# Patient Record
Sex: Female | Born: 1945 | Race: White | Hispanic: No | Marital: Married | Smoking: Never smoker
Health system: Southern US, Community
[De-identification: ages and names within clinical notes are randomized; demographics above are authoritative.]

## PROBLEM LIST (undated history)

## (undated) DIAGNOSIS — M1990 Unspecified osteoarthritis, unspecified site: Secondary | ICD-10-CM

## (undated) HISTORY — PX: NO PAST SURGERIES: SHX2092

## (undated) HISTORY — DX: Unspecified osteoarthritis, unspecified site: M19.90

---

## 2006-09-13 ENCOUNTER — Ambulatory Visit: Payer: Self-pay

## 2007-11-28 IMAGING — CR RIGHT INDEX FINGER 2+V
1 series · 3 of 3 positions shown · non-contrast
Comparison: none

REASON FOR EXAM: xray rt index finger dr Sayyad [phone] pain cut finger
COMMENTS:

[Series 1: view not recorded · 0.17mm/px · 3 of 3 slices shown]
[im 1/3]
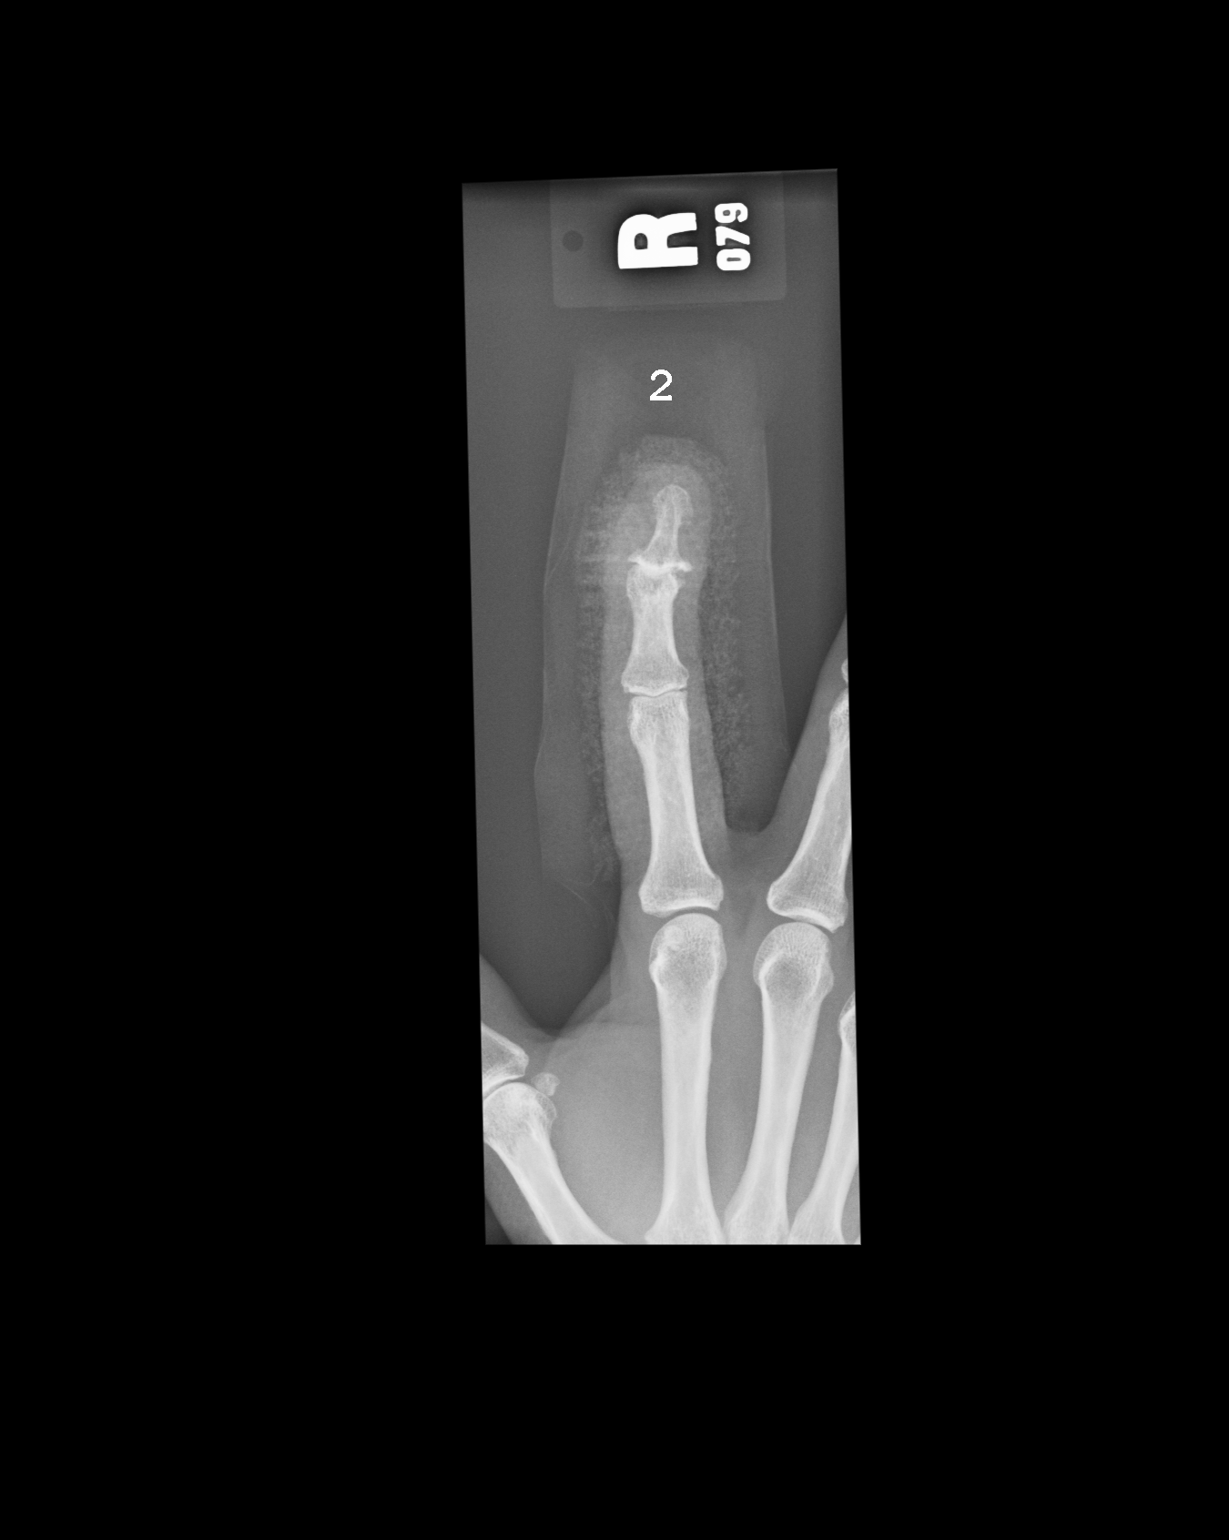
[im 2/3]
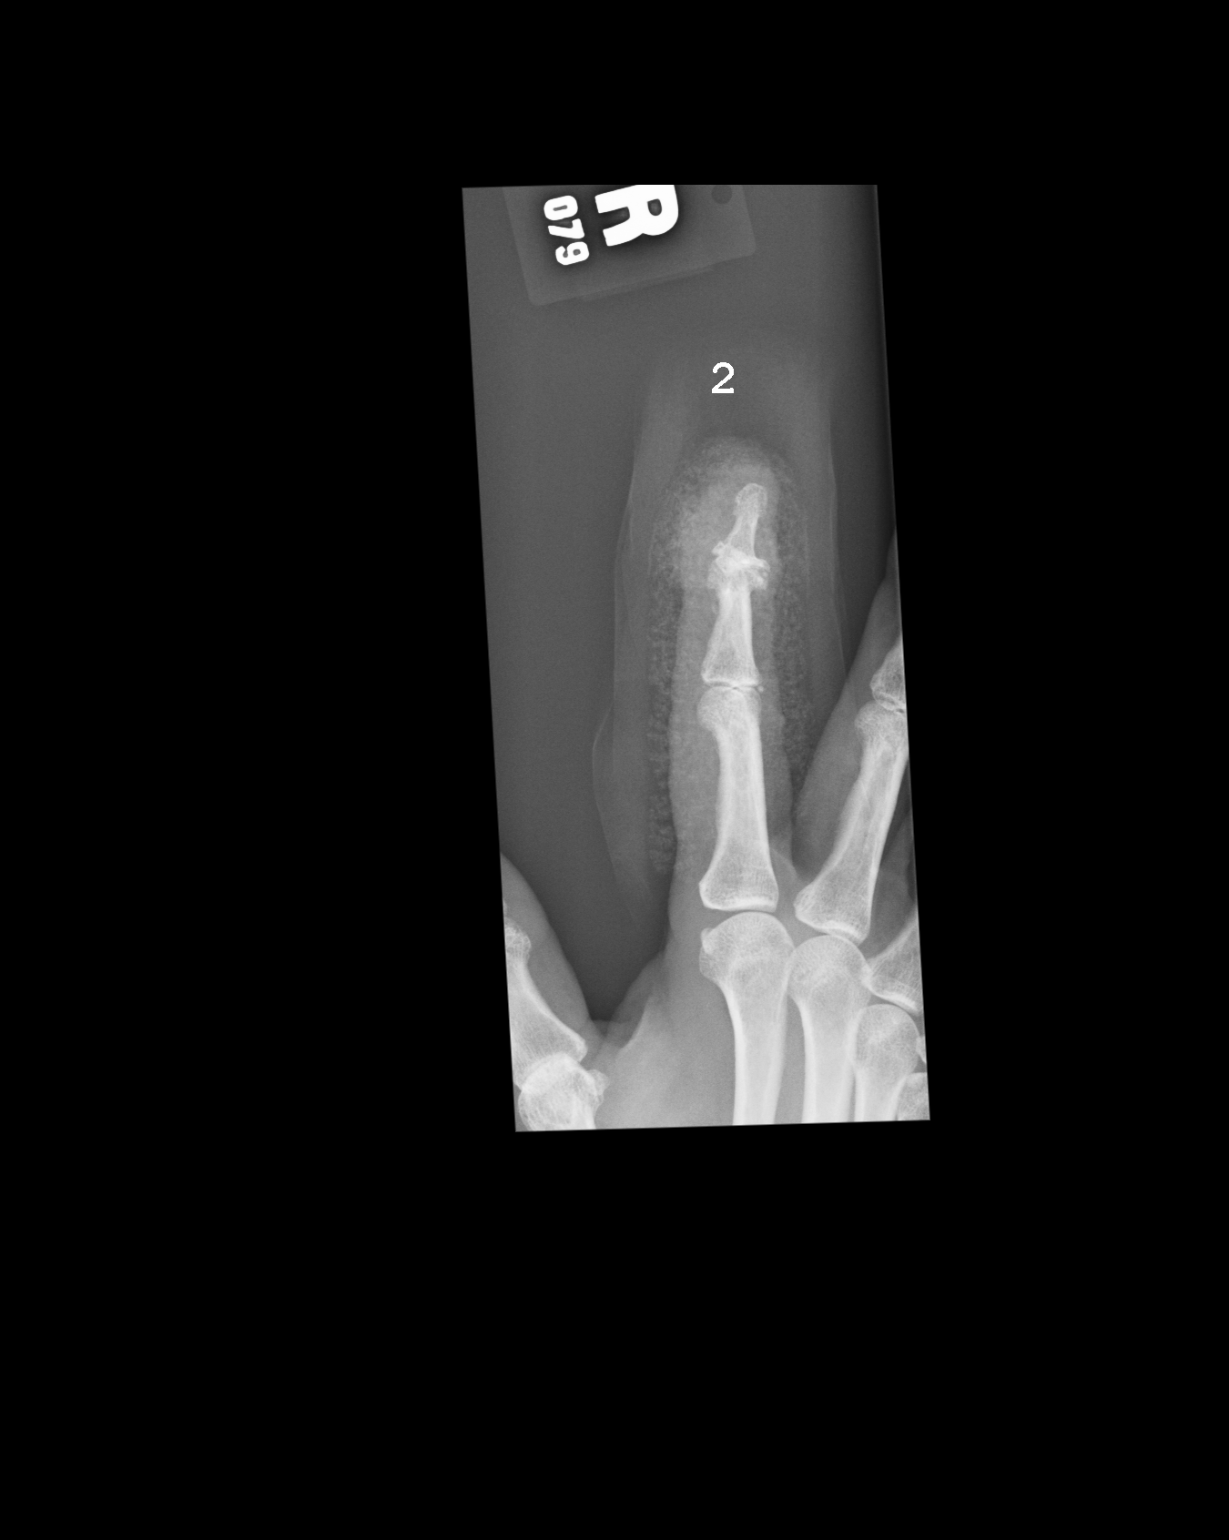
[im 3/3]
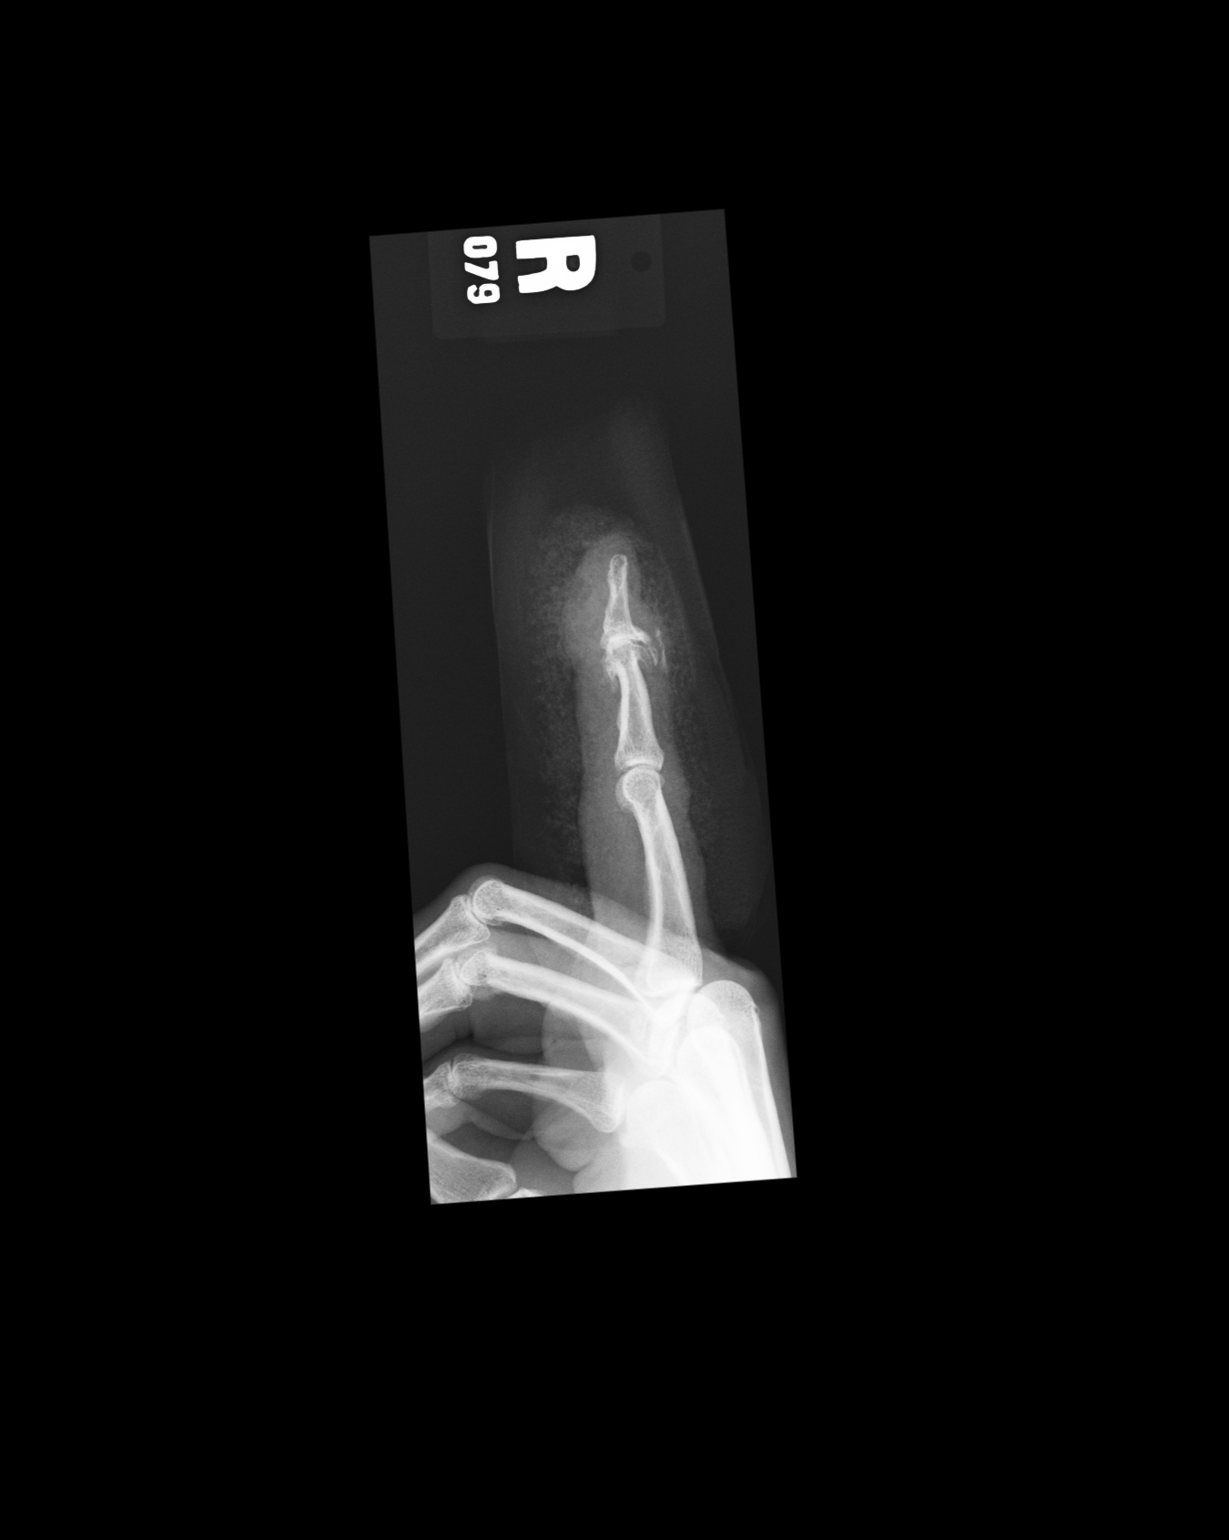

[3 of 3 positions shown; findings below may reference images not displayed]

PROCEDURE:     DXR - DXR FINGER INDEX 2ND DIGIT RT HA  - September 13, 2006  [DATE]

RESULT:     Three views were obtained. There is a bandage present about the
index finger. There is marked deformity of the DIP joint compatible with
arthritic spurring. No acute fracture is seen. No soft tissue foreign body
is identified.
IMPRESSION: 1. No acute fracture is identified.
2. Arthritic changes are noted at the DIP joint.

## 2018-01-02 ENCOUNTER — Telehealth: Payer: Self-pay | Admitting: Internal Medicine

## 2018-01-02 NOTE — Telephone Encounter (Signed)
Copied from CRM [id number]. Topic: Quick Communication - See Telephone Encounter >> Jan 02, 2018  4:30 PM Jens Som A wrote: CRM for notification. See Telephone encounter for: 01/02/18. Patient husband called to get Debra Barr in as a new patient of Dr. Lorin Picket.  Dr. Lorin Picket gave verbal ok to her patients Debra Barr (Son) & Debra Barr (Daugther in [place]) .  [facility] wanted to write documentation that it was [facility] to schedule as new patient. Please advise Patients call back number [phone]

## 2018-01-02 NOTE — Telephone Encounter (Signed)
FYI

## 2018-01-03 NOTE — Telephone Encounter (Signed)
msg sent to provider 

## 2018-01-04 NOTE — Telephone Encounter (Signed)
ok'd per Dr Lorin Picket lm to schedule new pt appt

## 2018-01-05 NOTE — Telephone Encounter (Signed)
° °  Husband call to make pt appt .there is no availability will not allow [facility] to schedule please call   [phone]

## 2018-05-30 ENCOUNTER — Telehealth: Payer: Self-pay | Admitting: Internal Medicine

## 2018-05-30 NOTE — Telephone Encounter (Signed)
Copied from CRM [id number]. Topic: Appointment Scheduling - Scheduling Inquiry for Clinic >> May 30, 2018  9:41 AM Gean Birchwood R wrote: Patients husband called in and stated she canceled her new patient appt that was scheduled for 06/01/2018 at 2pm with Dr Lorin Picket back in September. And she really needs this appt. Patient has dementia and didn't know what she was doing when she canceled. Husband is requesting a call back to see if patient can be worked back in.  CB# [phone]

## 2018-05-30 NOTE — Telephone Encounter (Signed)
Copied from CRM [id number]. Topic: Appointment Scheduling - Scheduling Inquiry for Clinic >> May 30, 2018  9:41 AM Gean Birchwood R wrote: Patients husband called in and stated she canceled her new patient appt that was scheduled for 06/01/2018 at 2pm with Dr Lorin Picket back in September. And she really needs this appt. Patient has dementia and didn't know what she was doing when she canceled. Husband is requesting a call back to see if patient can be worked back in.  CB# [phone] >> May 30, 2018  9:46 AM Gean Birchwood R wrote: Appointment Scheduled.

## 2018-06-01 ENCOUNTER — Encounter: Payer: Self-pay | Admitting: Internal Medicine

## 2018-06-01 ENCOUNTER — Ambulatory Visit: Payer: Self-pay | Admitting: Internal Medicine

## 2018-06-01 ENCOUNTER — Ambulatory Visit: Payer: Medicare Other | Admitting: Internal Medicine

## 2018-06-01 VITALS — BP 142/80 | HR 83 | Temp 97.9°F | Resp 16 | Ht 63.5 in | Wt 178.4 lb

## 2018-06-01 DIAGNOSIS — M7660 Achilles tendinitis, unspecified leg: Secondary | ICD-10-CM | POA: Diagnosis not present

## 2018-06-01 DIAGNOSIS — Z1211 Encounter for screening for malignant neoplasm of colon: Secondary | ICD-10-CM

## 2018-06-01 DIAGNOSIS — R030 Elevated blood-pressure reading, without diagnosis of hypertension: Secondary | ICD-10-CM | POA: Diagnosis not present

## 2018-06-01 DIAGNOSIS — Z1239 Encounter for other screening for malignant neoplasm of breast: Secondary | ICD-10-CM

## 2018-06-01 DIAGNOSIS — R413 Other amnesia: Secondary | ICD-10-CM | POA: Diagnosis not present

## 2018-06-01 LAB — TSH: TSH: 1.89 u[IU]/mL (ref 0.35–4.50)

## 2018-06-01 LAB — CBC WITH DIFFERENTIAL/PLATELET
BASOS ABS: 0 10*3/uL (ref 0.0–0.1)
Basophils Relative: 0.3 % (ref 0.0–3.0)
Eosinophils Absolute: 0 10*3/uL (ref 0.0–0.7)
Eosinophils Relative: 0.8 % (ref 0.0–5.0)
HCT: 43.5 % (ref 36.0–46.0)
HEMOGLOBIN: 14.5 g/dL (ref 12.0–15.0)
LYMPHS ABS: 1.7 10*3/uL (ref 0.7–4.0)
Lymphocytes Relative: 28.9 % (ref 12.0–46.0)
MCHC: 33.2 g/dL (ref 30.0–36.0)
MCV: 88.7 fl (ref 78.0–100.0)
MONOS PCT: 5.2 % (ref 3.0–12.0)
Monocytes Absolute: 0.3 10*3/uL (ref 0.1–1.0)
NEUTROS PCT: 64.8 % (ref 43.0–77.0)
Neutro Abs: 3.7 10*3/uL (ref 1.4–7.7)
Platelets: 332 10*3/uL (ref 150.0–400.0)
RBC: 4.91 Mil/uL (ref 3.87–5.11)
RDW: 14.8 % (ref 11.5–15.5)
WBC: 5.8 10*3/uL (ref 4.0–10.5)

## 2018-06-01 LAB — VITAMIN B12: VITAMIN B 12: 171 pg/mL — AB (ref 211–911)

## 2018-06-01 LAB — COMPREHENSIVE METABOLIC PANEL
ALBUMIN: 4.3 g/dL (ref 3.5–5.2)
ALK PHOS: 155 U/L — AB (ref 39–117)
ALT: 13 U/L (ref 0–35)
AST: 16 U/L (ref 0–37)
BILIRUBIN TOTAL: 0.6 mg/dL (ref 0.2–1.2)
BUN: 12 mg/dL (ref 6–23)
CO2: 25 mEq/L (ref 19–32)
CREATININE: 0.65 mg/dL (ref 0.40–1.20)
Calcium: 9.4 mg/dL (ref 8.4–10.5)
Chloride: 105 mEq/L (ref 96–112)
GFR: 89.14 mL/min (ref >60.00)
GLUCOSE: 83 mg/dL (ref 70–99)
Potassium: 3.7 mEq/L (ref 3.5–5.1)
SODIUM: 142 meq/L (ref 135–145)
TOTAL PROTEIN: 7.7 g/dL (ref 6.0–8.3)

## 2018-06-01 LAB — LIPID PANEL
CHOL/HDL RATIO: 4
Cholesterol: 216 mg/dL — ABNORMAL HIGH (ref 0–200)
HDL: 55 mg/dL (ref >39.00)
LDL Cholesterol: 145 mg/dL — ABNORMAL HIGH (ref 0–99)
NONHDL: 160.75
Triglycerides: 77 mg/dL (ref 0.0–149.0)
VLDL: 15.4 mg/dL (ref 0.0–40.0)

## 2018-06-01 NOTE — Progress Notes (Addendum)
Patient ID: Wiliam KeSandra M Sciortino, female   DOB: 06/04/1945, 73 y.o.   MRN: [id number]   Subjective:    Patient ID: Wiliam KeSandra M Tillery, female    DOB: 10/05/1945, 73 y.o.   MRN: [id number]  HPI  Patient here to establish care.  She was previously followed at [facility].  Reports she has never been in the hospital for anything other than child birth.  Has four children - 3 sons and 1 daughter.  Tries to stay active.  No chest pain.  No sob. No acid reflux.  No abdominal pain.  Bowels moving.  No urine change.  States has never had to be on cholesterol medication or blood pressure medication.  Works in her garden.  Does report some pain in her right achilles.  Persistent.  Worse at times.  Also reports that her husband has been concerned about some memory change.  Declines mammogram and colonoscopy.     Past Medical History:  Diagnosis Date  . Arthritis    Past Surgical History:  Procedure Laterality Date  . NO PAST SURGERIES     Family History  Problem Relation Age of Onset  . Arthritis Mother   . Early death Father   . Hyperlipidemia Father   . Hypertension Brother   . Kidney disease Brother    Social History   Socioeconomic History  . Marital status: Married    Spouse name: Not on file  . Number of children: Not on file  . Years of education: Not on file  . Highest education level: Not on file  Occupational History  . Not on file  Social Needs  . Financial resource strain: Not on file  . Food insecurity:    Worry: Not on file    Inability: Not on file  . Transportation needs:    Medical: Not on file    Non-medical: Not on file  Tobacco Use  . Smoking status: Never Smoker  . Smokeless tobacco: Never Used  Substance and Sexual Activity  . Alcohol use: Yes    Frequency: Never  . Drug use: Never  . Sexual activity: Not Currently  Lifestyle  . Physical activity:    Days per week: Not on file    Minutes per session: Not on file  . Stress: Not on file  Relationships  .  Social connections:    Talks on phone: Not on file    Gets together: Not on file    Attends religious service: Not on file    Active member of club or organization: Not on file    Attends meetings of clubs or organizations: Not on file    Relationship status: Not on file  Other Topics Concern  . Not on file  Social History Narrative  . Not on file    Outpatient Encounter Medications as of 06/01/2018  Medication Sig  . Ascorbic Acid (VITAMIN C PO) Take by mouth.  . Coenzyme Q10 (COQ10 PO) Take by mouth.  . Omega-3 Fatty Acids (FISH OIL PO) Take by mouth.   No facility-administered encounter medications on file as of 06/01/2018.     Review of Systems  Constitutional: Negative for appetite change and unexpected weight change.  HENT: Negative for congestion and sinus pressure.   Respiratory: Negative for cough, chest tightness and shortness of breath.   Cardiovascular: Negative for chest pain, palpitations and leg swelling.  Gastrointestinal: Negative for abdominal pain, diarrhea, nausea and vomiting.  Genitourinary: Negative for difficulty urinating and dysuria.  Musculoskeletal:  Negative for joint swelling and myalgias.       Pain - right achilles.    Skin: Negative for color change and rash.  Neurological: Negative for dizziness, light-headedness and headaches.  Psychiatric/Behavioral: Negative for agitation and dysphoric mood.       Objective:    Physical Exam Constitutional:      General: She is not in acute distress.    Appearance: Normal appearance.  HENT:     Nose: Nose normal. No congestion.     Mouth/Throat:     Pharynx: No oropharyngeal exudate or posterior oropharyngeal erythema.  Neck:     Musculoskeletal: Neck supple. No muscular tenderness.     Thyroid: No thyromegaly.  Cardiovascular:     Rate and Rhythm: Normal rate and regular rhythm.  Pulmonary:     Effort: No respiratory distress.     Breath sounds: Normal breath sounds. No wheezing.  Abdominal:      General: Bowel sounds are normal.     Palpations: Abdomen is soft.     Tenderness: There is no abdominal tenderness.  Musculoskeletal:        General: No swelling or tenderness.     Comments: Increased pain with palpation of the right achilles.    Lymphadenopathy:     Cervical: No cervical adenopathy.  Skin:    Findings: No erythema or rash.  Neurological:     Mental Status: She is alert.  Psychiatric:        Mood and Affect: Mood normal.        Behavior: Behavior normal.     BP (!) 142/80 (BP Location: Left Arm, Patient Position: Sitting, Cuff Size: Normal)   Pulse 83   Temp 97.9 F (36.6 C) (Oral)   Resp 16   Ht 5' 3.5" (1.613 m)   Wt 178 lb 6.4 oz (80.9 kg)   SpO2 97%   BMI 31.11 kg/m  Wt Readings from Last 3 Encounters:  06/01/18 178 lb 6.4 oz (80.9 kg)       Assessment & Plan:   Problem List Items Addressed This Visit    Elevated blood pressure reading    Blood pressure elevated here in the office.  Has never been on medication.  Will have her spot check her pressure.  Get her back in soon to reassess.  If persistent elevation, will start blood pressure medication.        Relevant Orders   CBC with Differential/Platelet (Completed)   Comprehensive metabolic panel (Completed)   Lipid panel (Completed)   Memory change - Primary    Discussed with her today.  She has not noticed a significant change.  States her husband has discussed this with her.  Will check routine labs including tsh and B12.  Obtain outside records.  On no prescription medication.  Follow.        Relevant Orders   TSH (Completed)   Vitamin B12 (Completed)   Pain in Achilles tendon    Persistent pain and tenderness.  Exam as outlined.  Refer to podiatry.        Relevant Orders   Ambulatory referral to Podiatry    Other Visit Diagnoses    Breast cancer screening       Declines mammogram.    Colon cancer screening       Declines.         Dale Durhamharlene Kellina Dreese, MD

## 2018-06-04 ENCOUNTER — Encounter: Payer: Self-pay | Admitting: Internal Medicine

## 2018-06-04 DIAGNOSIS — M7660 Achilles tendinitis, unspecified leg: Secondary | ICD-10-CM | POA: Insufficient documentation

## 2018-06-04 DIAGNOSIS — R413 Other amnesia: Secondary | ICD-10-CM | POA: Insufficient documentation

## 2018-06-04 DIAGNOSIS — I10 Essential (primary) hypertension: Secondary | ICD-10-CM | POA: Insufficient documentation

## 2018-06-04 NOTE — Assessment & Plan Note (Signed)
Blood pressure elevated here in the office.  Has never been on medication.  Will have her spot check her pressure.  Get her back in soon to reassess.  If persistent elevation, will start blood pressure medication.

## 2018-06-04 NOTE — Assessment & Plan Note (Signed)
Persistent pain and tenderness.  Exam as outlined.  Refer to podiatry.

## 2018-06-04 NOTE — Assessment & Plan Note (Signed)
Discussed with her today.  She has not noticed a significant change.  States her husband has discussed this with her.  Will check routine labs including tsh and B12.  Obtain outside records.  On no prescription medication.  Follow.

## 2018-06-04 NOTE — Addendum Note (Signed)
Addended by: Charm Barges on: 06/04/2018 09:01 AM   Modules accepted: Orders

## 2018-06-14 ENCOUNTER — Ambulatory Visit (INDEPENDENT_AMBULATORY_CARE_PROVIDER_SITE_OTHER): Payer: Medicare Other | Admitting: Lab

## 2018-06-14 DIAGNOSIS — E538 Deficiency of other specified B group vitamins: Secondary | ICD-10-CM

## 2018-06-14 MED ORDER — CYANOCOBALAMIN 1000 MCG/ML IJ SOLN
1000.0000 ug | Freq: Once | INTRAMUSCULAR | Status: AC
  Administered 2018-06-14: 1000 ug via INTRAMUSCULAR

## 2018-06-14 NOTE — Progress Notes (Addendum)
Pt here today for first  Vit-B12 injection in Right Deltoid. Pt tolerated well.  Reviewed.  Dr Lorin Picket

## 2018-06-29 ENCOUNTER — Ambulatory Visit (INDEPENDENT_AMBULATORY_CARE_PROVIDER_SITE_OTHER): Payer: Medicare Other | Admitting: Internal Medicine

## 2018-06-29 ENCOUNTER — Encounter: Payer: Self-pay | Admitting: Internal Medicine

## 2018-06-29 VITALS — BP 160/100 | HR 77 | Temp 97.6°F | Resp 16 | Wt 179.2 lb

## 2018-06-29 DIAGNOSIS — I10 Essential (primary) hypertension: Secondary | ICD-10-CM

## 2018-06-29 DIAGNOSIS — E538 Deficiency of other specified B group vitamins: Secondary | ICD-10-CM

## 2018-06-29 DIAGNOSIS — R413 Other amnesia: Secondary | ICD-10-CM

## 2018-06-29 MED ORDER — AMLODIPINE BESYLATE 5 MG PO TABS: 5.0000 mg | ORAL_TABLET | Freq: Every day | ORAL | 2 refills | Status: DC

## 2018-06-29 MED ORDER — CYANOCOBALAMIN 1000 MCG/ML IJ SOLN
1000.0000 ug | Freq: Once | INTRAMUSCULAR | Status: AC
  Administered 2018-06-29: 1000 ug via INTRAMUSCULAR

## 2018-06-29 NOTE — Progress Notes (Signed)
Patient ID: Debra Barr, female   DOB: 11/12/1945, 73 y.o.   MRN: [id number]   Subjective:    Patient ID: Debra Barr, female    DOB: 06-26-1945, 73 y.o.   MRN: [id number]  HPI  Patient here for a scheduled follow up.  She reports she is doing relatively well.  Saw her as a new pt last visit.  Started b12 injections.  Stays active.  No chest pain.  No sob.  No acid reflux.  No abdominal pain.  Bowels moving.  Discussed any concerns she may have regarding her memory.  Performed mini mental status.  She knew day, date and year.  Knew the president.  Knew how to spell world backwards and subtract by serial 7s.  Also could recall 2/3 objects.  States her main concerns is remembering names.  Discussed blood pressure elevation.     Past Medical History:  Diagnosis Date  . Arthritis    Past Surgical History:  Procedure Laterality Date  . NO PAST SURGERIES     Family History  Problem Relation Age of Onset  . Arthritis Mother   . Early death Father   . Hyperlipidemia Father   . Hypertension Brother   . Kidney disease Brother    Social History   Socioeconomic History  . Marital status: Married    Spouse name: Not on file  . Number of children: Not on file  . Years of education: Not on file  . Highest education level: Not on file  Occupational History  . Not on file  Social Needs  . Financial resource strain: Not on file  . Food insecurity:    Worry: Not on file    Inability: Not on file  . Transportation needs:    Medical: Not on file    Non-medical: Not on file  Tobacco Use  . Smoking status: Never Smoker  . Smokeless tobacco: Never Used  Substance and Sexual Activity  . Alcohol use: Yes    Frequency: Never  . Drug use: Never  . Sexual activity: Not Currently  Lifestyle  . Physical activity:    Days per week: Not on file    Minutes per session: Not on file  . Stress: Not on file  Relationships  . Social connections:    Talks on phone: Not on file    Gets  together: Not on file    Attends religious service: Not on file    Active member of club or organization: Not on file    Attends meetings of clubs or organizations: Not on file    Relationship status: Not on file  Other Topics Concern  . Not on file  Social History Narrative  . Not on file    Outpatient Encounter Medications as of 06/29/2018  Medication Sig  . Ascorbic Acid (VITAMIN C PO) Take by mouth.  . Coenzyme Q10 (COQ10 PO) Take by mouth.  . Omega-3 Fatty Acids (FISH OIL PO) Take by mouth.  Marland Kitchen amLODipine (NORVASC) 5 MG tablet Take 1 tablet (5 mg total) by mouth daily.  . [EXPIRED] cyanocobalamin ((VITAMIN B-12)) injection 1,000 mcg    No facility-administered encounter medications on file as of 06/29/2018.     Review of Systems  Constitutional: Negative for appetite change and unexpected weight change.  HENT: Negative for congestion and sinus pressure.   Respiratory: Negative for cough, chest tightness and shortness of breath.   Cardiovascular: Negative for chest pain, palpitations and leg swelling.  Gastrointestinal: Negative for  abdominal pain, diarrhea, nausea and vomiting.  Genitourinary: Negative for difficulty urinating and dysuria.  Musculoskeletal: Negative for joint swelling and myalgias.  Skin: Negative for color change and rash.  Neurological: Negative for dizziness, light-headedness and headaches.  Psychiatric/Behavioral: Negative for agitation and dysphoric mood.       Objective:    Physical Exam Constitutional:      General: She is not in acute distress.    Appearance: Normal appearance.  HENT:     Nose: Nose normal. No congestion.     Mouth/Throat:     Pharynx: No oropharyngeal exudate or posterior oropharyngeal erythema.  Neck:     Musculoskeletal: Neck supple. No muscular tenderness.     Thyroid: No thyromegaly.  Cardiovascular:     Rate and Rhythm: Normal rate and regular rhythm.  Pulmonary:     Effort: No respiratory distress.     Breath  sounds: Normal breath sounds. No wheezing.  Abdominal:     General: Bowel sounds are normal.     Palpations: Abdomen is soft.     Tenderness: There is no abdominal tenderness.  Musculoskeletal:        General: No swelling or tenderness.  Lymphadenopathy:     Cervical: No cervical adenopathy.  Skin:    Findings: No erythema or rash.  Neurological:     Mental Status: She is alert.  Psychiatric:        Mood and Affect: Mood normal.        Behavior: Behavior normal.     BP (!) 160/100   Pulse 77   Temp 97.6 F (36.4 C) (Oral)   Resp 16   Wt 179 lb 3.2 oz (81.3 kg)   SpO2 99%   BMI 31.25 kg/m  Wt Readings from Last 3 Encounters:  06/29/18 179 lb 3.2 oz (81.3 kg)  06/01/18 178 lb 6.4 oz (80.9 kg)     Lab Results  Component Value Date   WBC 5.8 06/01/2018   HGB 14.5 06/01/2018   HCT 43.5 06/01/2018   PLT 332.0 06/01/2018   GLUCOSE 83 06/01/2018   CHOL 216 (H) 06/01/2018   TRIG 77.0 06/01/2018   HDL 55.00 06/01/2018   LDLCALC 145 (H) 06/01/2018   ALT 13 06/01/2018   AST 16 06/01/2018   NA 142 06/01/2018   K 3.7 06/01/2018   CL 105 06/01/2018   CREATININE 0.65 06/01/2018   BUN 12 06/01/2018   CO2 25 06/01/2018   TSH 1.89 06/01/2018       Assessment & Plan:   Problem List Items Addressed This Visit    Hypertension, essential    Blood pressure remains elevated.  Discussed with her today.  Discussed starting a blood pressure medication.  She had some hesitations.  Agreed to start amlodipine 5mg  q day.  Follow pressures.  Follow metabolic panel.       Relevant Medications   amLODipine (NORVASC) 5 MG tablet   Memory change    Did well on her mini mental status exam.  Continue B12 injections.  Hold on further evaluation at this time.  Follow.         Other Visit Diagnoses    B12 deficiency    -  Primary   Relevant Medications   cyanocobalamin ((VITAMIN B-12)) injection 1,000 mcg (Completed)       Dale Baytown, MD

## 2018-07-01 ENCOUNTER — Encounter: Payer: Self-pay | Admitting: Internal Medicine

## 2018-07-01 NOTE — Assessment & Plan Note (Signed)
Did well on her mini mental status exam.  Continue B12 injections.  Hold on further evaluation at this time.  Follow.

## 2018-07-01 NOTE — Assessment & Plan Note (Signed)
Blood pressure remains elevated.  Discussed with her today.  Discussed starting a blood pressure medication.  She had some hesitations.  Agreed to start amlodipine 5mg  q day.  Follow pressures.  Follow metabolic panel.

## 2018-08-15 ENCOUNTER — Other Ambulatory Visit: Payer: Self-pay

## 2018-08-15 ENCOUNTER — Ambulatory Visit (INDEPENDENT_AMBULATORY_CARE_PROVIDER_SITE_OTHER): Payer: Medicare Other

## 2018-08-15 DIAGNOSIS — E538 Deficiency of other specified B group vitamins: Secondary | ICD-10-CM

## 2018-08-15 MED ORDER — CYANOCOBALAMIN 1000 MCG/ML IJ SOLN
1000.0000 ug | Freq: Once | INTRAMUSCULAR | Status: AC
  Administered 2018-08-15: 1000 ug via INTRAMUSCULAR

## 2018-08-15 NOTE — Progress Notes (Addendum)
Patient presented today for B12 injection.  Administered IM in left deltoid.  Patient tolerated well.  Jelani Trueba, CMA  Reviewed.  Dr Scott   

## 2018-09-19 ENCOUNTER — Ambulatory Visit (INDEPENDENT_AMBULATORY_CARE_PROVIDER_SITE_OTHER): Payer: Medicare Other | Admitting: *Deleted

## 2018-09-19 ENCOUNTER — Other Ambulatory Visit: Payer: Self-pay

## 2018-09-19 DIAGNOSIS — E538 Deficiency of other specified B group vitamins: Secondary | ICD-10-CM

## 2018-09-20 MED ORDER — CYANOCOBALAMIN 1000 MCG/ML IJ SOLN
1000.0000 ug | Freq: Once | INTRAMUSCULAR | Status: AC
  Administered 2018-09-19: 11:00:00 1000 ug via INTRAMUSCULAR

## 2018-09-20 NOTE — Progress Notes (Addendum)
Patient presented for B 12 injection to right deltoid, patient voiced no concerns nor showed any signs of distress during injection.  Reviewed.  Dr Scott 

## 2018-10-03 ENCOUNTER — Other Ambulatory Visit: Payer: Self-pay | Admitting: Internal Medicine

## 2018-10-24 ENCOUNTER — Ambulatory Visit: Payer: Medicare Other

## 2018-10-25 ENCOUNTER — Ambulatory Visit (INDEPENDENT_AMBULATORY_CARE_PROVIDER_SITE_OTHER): Payer: Medicare Other

## 2018-10-25 ENCOUNTER — Other Ambulatory Visit: Payer: Self-pay

## 2018-10-25 DIAGNOSIS — E538 Deficiency of other specified B group vitamins: Secondary | ICD-10-CM | POA: Diagnosis not present

## 2018-10-26 MED ORDER — CYANOCOBALAMIN 1000 MCG/ML IJ SOLN
1000.0000 ug | Freq: Once | INTRAMUSCULAR | Status: AC
  Administered 2018-10-25: 1000 ug via INTRAMUSCULAR

## 2018-10-26 NOTE — Progress Notes (Addendum)
Patient presented today for B12 injection.  Administered IM in left deltoid.  Administered without incident.  Patient tolerated well with no signs of distress.  Reviewed.  Dr Scott  

## 2018-11-09 ENCOUNTER — Other Ambulatory Visit: Payer: Self-pay | Admitting: Internal Medicine

## 2018-11-13 ENCOUNTER — Other Ambulatory Visit: Payer: Self-pay

## 2018-11-13 ENCOUNTER — Encounter: Payer: Self-pay | Admitting: Internal Medicine

## 2018-11-13 ENCOUNTER — Ambulatory Visit (INDEPENDENT_AMBULATORY_CARE_PROVIDER_SITE_OTHER): Payer: Medicare Other | Admitting: Internal Medicine

## 2018-11-13 DIAGNOSIS — R748 Abnormal levels of other serum enzymes: Secondary | ICD-10-CM | POA: Diagnosis not present

## 2018-11-13 DIAGNOSIS — I10 Essential (primary) hypertension: Secondary | ICD-10-CM

## 2018-11-13 DIAGNOSIS — M7660 Achilles tendinitis, unspecified leg: Secondary | ICD-10-CM | POA: Diagnosis not present

## 2018-11-13 DIAGNOSIS — R413 Other amnesia: Secondary | ICD-10-CM

## 2018-11-13 DIAGNOSIS — E7800 Pure hypercholesterolemia, unspecified: Secondary | ICD-10-CM

## 2018-11-13 NOTE — Progress Notes (Signed)
Patient ID: Debra Barr, female   DOB: [birth year], 73 y.o.   MRN: [id number]   Virtual Visit via relephone Note  This visit type was conducted due to national recommendations for restrictions regarding the COVID-19 pandemic (e.g. social distancing).  This format is felt to be most appropriate for this patient at this time.  All issues noted in this document were discussed and addressed.  No physical exam was performed (except for noted visual exam findings with Video Visits).   I connected with Debra Barr by telephone and verified that I am speaking with the correct person using two identifiers. Location patient: home Location provider: work Persons participating in the telephone visit: patient, provider  I discussed the limitations, risks, security and privacy concerns of performing an evaluation and management service by telephone and the availability of in person appointments.  The patient expressed understanding and agreed to proceed.  Reason for visit: scheduled follow up.    HPI: She reports she is doing relatively well.  Trying to stay in due to covid restrictions.  No fever.  No cough, chest congestion or sob.  No chest pain.  Tries to stay active.  No acid reflux.  No abdominal pain.  Bowels moving.  On amlodipine.  Recently started for elevated blood pressure.  States blood pressure is ok.  Husband checks.  Receiving b12 injections.  The previous pain she had in her achilles - resolved.  No problems now.  Discussed f/u labs.  Discussed immunizations.     ROS: See pertinent positives and negatives per HPI.  Past Medical History:  Diagnosis Date  . Arthritis     Past Surgical History:  Procedure Laterality Date  . NO PAST SURGERIES      Family History  Problem Relation Age of Onset  . Arthritis Mother   . Early death Father   . Hyperlipidemia Father   . Hypertension Brother   . Kidney disease Brother     SOCIAL HX: reviewed.    Current Outpatient Medications:   .  amLODipine (NORVASC) 5 MG tablet, TAKE ONE TABLET BY MOUTH DAILY, Disp: 90 tablet, Rfl: 1 .  Ascorbic Acid (VITAMIN C PO), Take by mouth., Disp: , Rfl:  .  Coenzyme Q10 (COQ10 PO), Take by mouth., Disp: , Rfl:  .  Omega-3 Fatty Acids (FISH OIL PO), Take by mouth., Disp: , Rfl:   EXAM:  GENERAL: alert.  Sounds to be in no acute distress.  Answering questions appropriately.    PSYCH/NEURO: pleasant and cooperative, no obvious depression or anxiety, speech and thought processing grossly intact  ASSESSMENT AND PLAN:  Discussed the following assessment and plan:  No problem-specific Assessment & Plan notes found for this encounter.    I discussed the assessment and treatment plan with the patient. The patient was provided an opportunity to ask questions and all were answered. The patient agreed with the plan and demonstrated an understanding of the instructions.   The patient was advised to call back or seek an in-person evaluation if the symptoms worsen or if the condition fails to improve as anticipated.  I provided 21 minutes of non-face-to-face time during this encounter.   Einar Pheasant, MD

## 2018-11-19 ENCOUNTER — Encounter: Payer: Self-pay | Admitting: Internal Medicine

## 2018-11-19 DIAGNOSIS — R748 Abnormal levels of other serum enzymes: Secondary | ICD-10-CM | POA: Insufficient documentation

## 2018-11-19 DIAGNOSIS — E7800 Pure hypercholesterolemia, unspecified: Secondary | ICD-10-CM | POA: Insufficient documentation

## 2018-11-19 NOTE — Assessment & Plan Note (Signed)
Started on amlodipine.  States blood pressures are ok now.  Continue amlodipine.  Have her spot check her pressure.  Follow metabolic panel.

## 2018-11-19 NOTE — Assessment & Plan Note (Signed)
Low cholesterol diet and exercise.  Follow lipid panel.   

## 2018-11-19 NOTE — Assessment & Plan Note (Signed)
Noted to be elevated on recent lab check.  Recheck liver panel.

## 2018-11-19 NOTE — Assessment & Plan Note (Signed)
Resolved

## 2018-11-19 NOTE — Assessment & Plan Note (Signed)
Continue b12 injections.  Follow.   

## 2018-11-28 ENCOUNTER — Telehealth: Payer: Self-pay

## 2018-11-28 ENCOUNTER — Ambulatory Visit (INDEPENDENT_AMBULATORY_CARE_PROVIDER_SITE_OTHER): Payer: Medicare Other

## 2018-11-28 ENCOUNTER — Other Ambulatory Visit: Payer: Self-pay

## 2018-11-28 DIAGNOSIS — E538 Deficiency of other specified B group vitamins: Secondary | ICD-10-CM

## 2018-11-28 MED ORDER — CYANOCOBALAMIN 1000 MCG/ML IJ SOLN
1000.0000 ug | Freq: Once | INTRAMUSCULAR | Status: AC
  Administered 2018-11-28: 14:00:00 1000 ug via INTRAMUSCULAR

## 2018-11-28 NOTE — Telephone Encounter (Signed)
Patient was in clinic today for a B12 nurse visit and inquired about getting the pneumonia shot.  I checked pt's chart and didn't see an order for a pneumonia vaccination.  Pt said this was discussed at her last virtual visit.  Also noticed that pt should scheduled a f/u lab appt. Pt unsure when next labs should be scheduled.  There are future lab orders already in pt's chart.  Pt will wait for a phone call from Dr. Bary Leriche nurse to be advised.

## 2018-11-28 NOTE — Progress Notes (Addendum)
Patient presented today for B12 injection.  Administered IM in the right deltoid.  Patient tolerated well with no signs of distress.  Reviewed.  Dr Scott  

## 2018-11-28 NOTE — Telephone Encounter (Signed)
Please schedule pt for fasting labs and a blood pressure check.  Also, if she is agreeable, give her prevnar - first pneumonia vaccine.  Confirm has not had any problems with any vaccines in the past.  Let her know that I apologize for the confusion and sorry for the inconvenience.

## 2018-11-29 NOTE — Telephone Encounter (Signed)
Pt was scheduled a physical on 02/13/19 and a nurse visit for a bp check and pneumonia vaccination and lab appt for fasting labs on 12/07/18.

## 2018-12-07 ENCOUNTER — Ambulatory Visit (INDEPENDENT_AMBULATORY_CARE_PROVIDER_SITE_OTHER): Payer: Medicare Other

## 2018-12-07 ENCOUNTER — Other Ambulatory Visit: Payer: Self-pay

## 2018-12-07 ENCOUNTER — Other Ambulatory Visit (INDEPENDENT_AMBULATORY_CARE_PROVIDER_SITE_OTHER): Payer: Medicare Other

## 2018-12-07 VITALS — BP 160/90 | HR 74

## 2018-12-07 DIAGNOSIS — R748 Abnormal levels of other serum enzymes: Secondary | ICD-10-CM

## 2018-12-07 DIAGNOSIS — I10 Essential (primary) hypertension: Secondary | ICD-10-CM | POA: Diagnosis not present

## 2018-12-07 DIAGNOSIS — E7800 Pure hypercholesterolemia, unspecified: Secondary | ICD-10-CM

## 2018-12-07 DIAGNOSIS — Z23 Encounter for immunization: Secondary | ICD-10-CM | POA: Diagnosis not present

## 2018-12-07 LAB — BASIC METABOLIC PANEL
BUN: 16 mg/dL (ref 6–23)
CO2: 30 mEq/L (ref 19–32)
Calcium: 9.2 mg/dL (ref 8.4–10.5)
Chloride: 104 mEq/L (ref 96–112)
Creatinine, Ser: 0.66 mg/dL (ref 0.40–1.20)
GFR: 87.46 mL/min (ref >60.00)
Glucose, Bld: 99 mg/dL (ref 70–99)
Potassium: 4.2 mEq/L (ref 3.5–5.1)
Sodium: 140 mEq/L (ref 135–145)

## 2018-12-07 LAB — LIPID PANEL
Cholesterol: 222 mg/dL — ABNORMAL HIGH (ref 0–200)
HDL: 51.3 mg/dL (ref >39.00)
LDL Cholesterol: 147 mg/dL — ABNORMAL HIGH (ref 0–99)
NonHDL: 170.29
Total CHOL/HDL Ratio: 4
Triglycerides: 116 mg/dL (ref 0.0–149.0)
VLDL: 23.2 mg/dL (ref 0.0–40.0)

## 2018-12-07 LAB — HEPATIC FUNCTION PANEL
ALT: 10 U/L (ref 0–35)
AST: 12 U/L (ref 0–37)
Albumin: 4.1 g/dL (ref 3.5–5.2)
Alkaline Phosphatase: 152 U/L — ABNORMAL HIGH (ref 39–117)
Bilirubin, Direct: 0.1 mg/dL (ref 0.0–0.3)
Total Bilirubin: 0.7 mg/dL (ref 0.2–1.2)
Total Protein: 6.8 g/dL (ref 6.0–8.3)

## 2018-12-11 NOTE — Progress Notes (Signed)
Patient presented today for nurse visit BP check per MD order (See telephone note on 11/28/18).  Patient reports compliance with prescribed BP medications: YES   Last dose of BP medication: last night  BP Readings from Last 3 Encounters:  12/07/18 (!) 160/90  06/29/18 (!) 160/100  06/01/18 (!) 142/80   Pulse Readings from Last 3 Encounters:  12/07/18 74  06/29/18 77  06/01/18 83   BP readings given to Dr. Nicki Reaper for review while patient was in office.  Per Dr. Nicki Reaper pt should go home and relax, take bp at [facility], and call office back with readings.  Pt is to schedule a f/u appt w/ Dr. Nicki Reaper for blood pressure.  Patient verbalized understanding of instructions.  Ok per Dr. Nicki Reaper to give Prevnar 13 vaccination today.  Observed patient after vaccine given.  Patient tolerated well with no signs of distress.   Dorise Bullion, CMA

## 2018-12-12 NOTE — Progress Notes (Signed)
Reviewed blood pressure.  I had advised for her to check blood pressure at home and notify us of readings.  She was to call back that day with blood pressure reading.  Please call pt and see how she is doing and how blood pressures are running.  Needs appt with me to discuss her blood pressure.

## 2018-12-13 ENCOUNTER — Telehealth: Payer: Self-pay

## 2018-12-13 NOTE — Telephone Encounter (Signed)
Called patient to follow up and schedule appt. She has appt on 9/18. I was not able to reach her and could not leave message. Called husband to let her know to call the office.

## 2018-12-13 NOTE — Telephone Encounter (Signed)
-----   Message from Einar Pheasant, MD sent at 12/12/2018  4:26 AM EDT -----   ----- Message ----- From: Dorise Bullion, CMA Sent: 12/11/2018   3:46 PM EDT To: Einar Pheasant, MD

## 2018-12-14 ENCOUNTER — Ambulatory Visit (INDEPENDENT_AMBULATORY_CARE_PROVIDER_SITE_OTHER): Payer: Medicare Other | Admitting: Internal Medicine

## 2018-12-14 ENCOUNTER — Other Ambulatory Visit: Payer: Self-pay

## 2018-12-14 DIAGNOSIS — E7800 Pure hypercholesterolemia, unspecified: Secondary | ICD-10-CM | POA: Diagnosis not present

## 2018-12-14 DIAGNOSIS — R748 Abnormal levels of other serum enzymes: Secondary | ICD-10-CM | POA: Diagnosis not present

## 2018-12-14 DIAGNOSIS — I10 Essential (primary) hypertension: Secondary | ICD-10-CM | POA: Diagnosis not present

## 2018-12-14 MED ORDER — AMLODIPINE BESYLATE 10 MG PO TABS: 10.0000 mg | ORAL_TABLET | Freq: Every day | ORAL | 2 refills | Status: DC

## 2018-12-14 NOTE — Progress Notes (Signed)
Patient ID: Debra Barr, female   DOB: [birth year], 73 y.o.   MRN: [id number]   Virtual Visit via telephone Note  This visit type was conducted due to national recommendations for restrictions regarding the COVID-19 pandemic (e.g. social distancing).  This format is felt to be most appropriate for this patient at this time.  All issues noted in this document were discussed and addressed.  No physical exam was performed (except for noted visual exam findings with Video Visits).   I connected with Gwynn Burly by telephone and verified that I am speaking with the correct person using two identifiers. Location patient: home Location provider: work Persons participating in the telephone visit: patient, provider  I discussed the limitations, risks, security and privacy concerns of performing an evaluation and management service by telephone and the availability of in person appointments.  The patient expressed understanding and agreed to proceed.   Reason for visit: work in appt  HPI: Work in appt to follow up on her elevated blood pressure.  States she is doing well.  Feels good.  No chest pain.  No sob.  No acid reflux.  No abdominal pain.  Bowels moving.  Blood pressures remain elevated:  136/75, 140/80, 149/92, 140/77 and 148/90.  Taking amlodipine.  No headache.  No dizziness.     ROS: See pertinent positives and negatives per HPI.  Past Medical History:  Diagnosis Date  . Arthritis     Past Surgical History:  Procedure Laterality Date  . NO PAST SURGERIES      Family History  Problem Relation Age of Onset  . Arthritis Mother   . Early death Father   . Hyperlipidemia Father   . Hypertension Brother   . Kidney disease Brother     SOCIAL HX: reviewed.    Current Outpatient Medications:  .  amLODipine (NORVASC) 10 MG tablet, Take 1 tablet (10 mg total) by mouth daily., Disp: 30 tablet, Rfl: 2 .  Ascorbic Acid (VITAMIN C PO), Take by mouth., Disp: , Rfl:  .  Coenzyme  Q10 (COQ10 PO), Take by mouth., Disp: , Rfl:  .  Omega-3 Fatty Acids (FISH OIL PO), Take by mouth., Disp: , Rfl:   EXAM:  VITALS per patient if applicable: 967/89, pulse 88  GENERAL: alert.  Sounds to be in no acute distress.  Answering questions appropriately.    PSYCH/NEURO: pleasant and cooperative, no obvious depression or anxiety, speech and thought processing grossly intact  ASSESSMENT AND PLAN:  Discussed the following assessment and plan:  Elevated alkaline phosphatase level Follow liver panel.    Hypercholesterolemia Low cholesterol diet and exercise.  Follow lipid panel.    Hypertension, essential Remains elevated.  Increase amlodipine to 10mg  q day.  Follow pressures.  Have her spot check her pressure.  Send in readings.  Follow metabolic panel.      I discussed the assessment and treatment plan with the patient. The patient was provided an opportunity to ask questions and all were answered. The patient agreed with the plan and demonstrated an understanding of the instructions.   The patient was advised to call back or seek an in-person evaluation if the symptoms worsen or if the condition fails to improve as anticipated.  I provided 21 minutes of non-face-to-face time during this encounter.   Einar Pheasant, MD

## 2018-12-18 ENCOUNTER — Encounter: Payer: Self-pay | Admitting: Internal Medicine

## 2018-12-18 NOTE — Assessment & Plan Note (Signed)
Low cholesterol diet and exercise.  Follow lipid panel.   

## 2018-12-18 NOTE — Assessment & Plan Note (Signed)
Follow liver panel.  

## 2018-12-18 NOTE — Assessment & Plan Note (Signed)
Remains elevated.  Increase amlodipine to 10mg  q day.  Follow pressures.  Have her spot check her pressure.  Send in readings.  Follow metabolic panel.

## 2019-01-02 ENCOUNTER — Ambulatory Visit (INDEPENDENT_AMBULATORY_CARE_PROVIDER_SITE_OTHER): Payer: Medicare Other

## 2019-01-02 ENCOUNTER — Other Ambulatory Visit: Payer: Self-pay

## 2019-01-02 DIAGNOSIS — E538 Deficiency of other specified B group vitamins: Secondary | ICD-10-CM | POA: Diagnosis not present

## 2019-01-02 MED ORDER — CYANOCOBALAMIN 1000 MCG/ML IJ SOLN
1000.0000 ug | Freq: Once | INTRAMUSCULAR | Status: AC
  Administered 2019-01-02: 1000 ug via INTRAMUSCULAR

## 2019-01-12 ENCOUNTER — Ambulatory Visit: Payer: Medicare Other | Admitting: Internal Medicine

## 2019-01-16 NOTE — Progress Notes (Addendum)
Patient presented for B 12 injection to left deltoid, patient voiced no concerns nor showed any signs of distress during injection.  Reviewed.  Dr Scott 

## 2019-02-06 ENCOUNTER — Ambulatory Visit (INDEPENDENT_AMBULATORY_CARE_PROVIDER_SITE_OTHER): Payer: Medicare Other

## 2019-02-06 ENCOUNTER — Other Ambulatory Visit: Payer: Self-pay

## 2019-02-06 DIAGNOSIS — E538 Deficiency of other specified B group vitamins: Secondary | ICD-10-CM

## 2019-02-06 MED ORDER — CYANOCOBALAMIN 1000 MCG/ML IJ SOLN
1000.0000 ug | Freq: Once | INTRAMUSCULAR | Status: AC
  Administered 2019-02-06: 1000 ug via INTRAMUSCULAR

## 2019-02-06 NOTE — Progress Notes (Addendum)
Patient presented today for B12 injection.  Administered IM in right deltoid.  Patient tolerated well with no signs of distress.  Reviewed.  Dr Scott    

## 2019-02-13 ENCOUNTER — Ambulatory Visit: Payer: Medicare Other | Admitting: Internal Medicine

## 2019-03-13 ENCOUNTER — Other Ambulatory Visit: Payer: Self-pay

## 2019-03-13 ENCOUNTER — Ambulatory Visit (INDEPENDENT_AMBULATORY_CARE_PROVIDER_SITE_OTHER): Payer: Medicare Other

## 2019-03-13 DIAGNOSIS — E538 Deficiency of other specified B group vitamins: Secondary | ICD-10-CM

## 2019-03-13 MED ORDER — CYANOCOBALAMIN 1000 MCG/ML IJ SOLN
1000.0000 ug | Freq: Once | INTRAMUSCULAR | Status: AC
  Administered 2019-03-13: 12:00:00 1000 ug via INTRAMUSCULAR

## 2019-03-13 NOTE — Progress Notes (Addendum)
Debra Barr presents today for injection per MD orders. B12 injection  administered IM in left Upper Arm. Administration without incident. Patient tolerated well.  Dhamar Gregory,cma   Reviewed.  Dr Nicki Reaper

## 2019-03-24 ENCOUNTER — Other Ambulatory Visit: Payer: Self-pay | Admitting: Internal Medicine

## 2019-04-16 ENCOUNTER — Other Ambulatory Visit: Payer: Self-pay

## 2019-04-16 ENCOUNTER — Ambulatory Visit (INDEPENDENT_AMBULATORY_CARE_PROVIDER_SITE_OTHER): Payer: Medicare Other | Admitting: *Deleted

## 2019-04-16 DIAGNOSIS — E538 Deficiency of other specified B group vitamins: Secondary | ICD-10-CM | POA: Diagnosis not present

## 2019-04-16 MED ORDER — CYANOCOBALAMIN 1000 MCG/ML IJ SOLN
1000.0000 ug | Freq: Once | INTRAMUSCULAR | Status: AC
  Administered 2019-04-16: 13:00:00 1000 ug via INTRAMUSCULAR

## 2019-04-16 NOTE — Progress Notes (Addendum)
Patient presented for B 12 injection to right deltoid, patient voiced no concerns nor showed any signs of distress during injection.  Reviewed.  Dr Scott 

## 2019-05-20 ENCOUNTER — Encounter: Payer: Self-pay | Admitting: Internal Medicine

## 2019-05-23 ENCOUNTER — Other Ambulatory Visit: Payer: Self-pay

## 2019-05-25 ENCOUNTER — Other Ambulatory Visit: Payer: Self-pay

## 2019-05-25 ENCOUNTER — Ambulatory Visit (INDEPENDENT_AMBULATORY_CARE_PROVIDER_SITE_OTHER): Payer: Medicare Other

## 2019-05-25 VITALS — Ht 63.5 in | Wt 179.0 lb

## 2019-05-25 DIAGNOSIS — Z0000 Encounter for general adult medical examination without abnormal findings: Secondary | ICD-10-CM | POA: Diagnosis not present

## 2019-05-25 NOTE — Progress Notes (Addendum)
Subjective:   Debra Barr is a 74 y.o. female who presents for an Initial Medicare Annual Wellness Visit.  Review of Systems    No ROS.  Medicare Wellness Virtual Visit.  Visual/audio telehealth visit, UTA vital signs.   Ht/Wt provided.  See social history for additional risk factors.    Cardiac Risk Factors include: advanced age (>76men, >15 women);hypertension     Objective:    Today's Vitals   05/25/19 1033  Weight: 179 lb (81.2 kg)  Height: 5' 3.5" (1.613 m)   Body mass index is 31.21 kg/m.  Advanced Directives 05/25/2019  Does Patient Have a Medical Advance Directive? Yes  Type of Advance Directive Living will  Does patient want to make changes to medical advance directive? No - Patient declined    Current Medications (verified) Outpatient Encounter Medications as of 05/25/2019  Medication Sig  . amLODipine (NORVASC) 10 MG tablet TAKE ONE TABLET BY MOUTH DAILY  . Ascorbic Acid (VITAMIN C PO) Take by mouth.  . Coenzyme Q10 (COQ10 PO) Take by mouth.  . Omega-3 Fatty Acids (FISH OIL PO) Take by mouth.   No facility-administered encounter medications on file as of 05/25/2019.    Allergies (verified) Patient has no allergy information on record.   History: Past Medical History:  Diagnosis Date  . Arthritis    Past Surgical History:  Procedure Laterality Date  . NO PAST SURGERIES     Family History  Problem Relation Age of Onset  . Arthritis Mother   . Early death Father   . Hyperlipidemia Father   . Hypertension Brother   . Kidney disease Brother    Social History   Socioeconomic History  . Marital status: Married    Spouse name: Not on file  . Number of children: Not on file  . Years of education: Not on file  . Highest education level: Not on file  Occupational History  . Not on file  Tobacco Use  . Smoking status: Never Smoker  . Smokeless tobacco: Never Used  Substance and Sexual Activity  . Alcohol use: Yes  . Drug use: Never  .  Sexual activity: Not Currently  Other Topics Concern  . Not on file  Social History Narrative  . Not on file   Social Determinants of Health   Financial Resource Strain: Low Risk   . Difficulty of Paying Living Expenses: Not hard at all  Food Insecurity: No Food Insecurity  . Worried About Programme researcher, broadcasting/film/video in the Last Year: Never true  . Ran Out of Food in the Last Year: Never true  Transportation Needs: No Transportation Needs  . Lack of Transportation (Medical): No  . Lack of Transportation (Non-Medical): No  Physical Activity:   . Days of Exercise per Week: Not on file  . Minutes of Exercise per Session: Not on file  Stress: No Stress Concern Present  . Feeling of Stress : Not at all  Social Connections: Unknown  . Frequency of Communication with Friends and Family: More than three times a week  . Frequency of Social Gatherings with Friends and Family: More than three times a week  . Attends Religious Services: Not on file  . Active Member of Clubs or Organizations: Not on file  . Attends Banker Meetings: Not on file  . Marital Status: Married    Tobacco Counseling Counseling given: Not Answered   Clinical Intake:  Pre-visit preparation completed: Yes  Diabetes: No  How often do you need to have someone help you when you read instructions, pamphlets, or other written materials from your doctor or pharmacy?: 1 - Never  Interpreter Needed?: No      Activities of Daily Living In your present state of health, do you have any difficulty performing the following activities: 05/25/2019  Hearing? N  Vision? N  Difficulty concentrating or making decisions? N  Walking or climbing stairs? N  Dressing or bathing? N  Doing errands, shopping? N  Preparing Food and eating ? N  Using the Toilet? N  In the past six months, have you accidently leaked urine? N  Do you have problems with loss of bowel control? N  Managing your Medications? N   Managing your Finances? N  Housekeeping or managing your Housekeeping? N  Some recent data might be hidden     Immunizations and Health Maintenance Immunization History  Administered Date(s) Administered  . Pneumococcal Conjugate-13 12/07/2018  . Tdap 09/06/2008   There are no preventive care reminders to display for this patient.  Patient Care Team: Dale Marriott-[place], MD as PCP - General (Internal Medicine)  Indicate any recent Medical Services you may have received from other than [facility] providers in the past year (date may be approximate).     Assessment:   This is a routine wellness examination for Debra Barr.  Nurse connected with patient 05/25/19 at 10:30 AM EST by a telephone enabled telemedicine application and verified that I am speaking with the correct person using two identifiers. Patient stated full name and DOB. Patient gave permission to continue with virtual visit. Patient's location was at home and Nurse's location was at [place] office.   Patient is alert and oriented x3. Patient denies difficulty focusing or concentrating. Patient likes to read, play computer games, complete puzzles for brain stimulation.   Health Maintenance Due: -Tdap, Influenza vaccine- declined.  - Hepatitis C Screening- declined.  See completed HM at the end of note.   Eye: Visual acuity not assessed. Virtual visit. Followed by their ophthalmologist.  Dental: UTD  Hearing: Demonstrates normal hearing during visit.  Safety:  Patient feels safe at home- yes Patient does have smoke detectors at home- yes Patient does wear sunscreen or protective clothing when in direct sunlight - yes Patient does wear seat belt when in a moving vehicle - yes Patient drives- yes Adequate lighting in walkways free from debris- yes Grab bars and handrails used as appropriate- yes Ambulates with an assistive device- no  Social: Alcohol intake - yes      Smoking history- never   Smokers in home? none  Illicit drug use? none  Medication: Taking as directed and without issues.  Self managed - yes   Covid-19: Precautions and sickness symptoms discussed. Wears mask, social distancing, hand hygiene as appropriate.   Activities of Daily Living Patient denies needing assistance with: household chores, feeding themselves, getting from bed to chair, getting to the toilet, bathing/showering, dressing, managing money, or preparing meals.   Discussed the importance of a healthy diet, water intake and the benefits of aerobic exercise.   Physical activity- active around the home.   Diet:  Regular Water: good intake Caffeine: 3 cups of green tea  Other Providers Patient Care Team: Dale Ward, MD as PCP - General (Internal Medicine)  Hearing/Vision screen  Hearing Screening   125Hz  250Hz  500Hz  1000Hz  2000Hz  3000Hz  4000Hz  6000Hz  8000Hz   Right ear:           Left  ear:           Comments: Patient is able to hear conversational tones without difficulty.  No issues reported.  Vision Screening Comments: Wears corrective lenses Visual acuity not assessed, virtual visit.  They have seen their ophthalmologist.     Dietary issues and exercise activities discussed: Current Exercise Habits: Home exercise routine, Intensity: Mild  Goals    . Follow up with Primary Care Provider     As needed      Depression Screen PHQ 2/9 Scores 05/25/2019 11/13/2018  PHQ - 2 Score 0 0    Fall Risk Fall Risk  05/25/2019  Falls in the past year? 0  Follow up Falls evaluation completed    Timed Get Up and Go Performed no, virtual visit  Cognitive Function:     6CIT Screen 05/25/2019  What Year? 0 points  What month? 0 points  What time? 0 points  Count back from 20 0 points  Months in reverse 0 points  Repeat phrase 0 points  Total Score 0    Screening Tests Health Maintenance  Topic Date Due  . INFLUENZA VACCINE  07/25/2019 (Originally 11/25/2018)  . TETANUS/TDAP  05/24/2020  (Originally 09/07/2018)  . Hepatitis C Screening  05/24/2020 (Originally December 05, 1945)  . PNA vac Low Risk Adult (2 of 2 - PPSV23) 12/07/2019  . MAMMOGRAM  Discontinued  . DEXA SCAN  Discontinued  . COLONOSCOPY  Discontinued     Plan:   Keep all routine maintenance appointments.   Follow up 05/29/19 @ 3:30.  Medicare Attestation I have personally reviewed: The patient's medical and social history Their use of alcohol, tobacco or illicit drugs Their current medications and supplements The patient's functional ability including ADLs,fall risks, home safety risks, cognitive, and hearing and visual impairment Diet and physical activities Evidence for depression   I have reviewed and discussed with patient certain preventive protocols, quality metrics, and best practice recommendations.     Varney Biles, LPN   1/60/1093    Reviewed above information.  Agree with assessment and plan.    Dr Nicki Reaper

## 2019-05-25 NOTE — Patient Instructions (Addendum)
  Debra Barr , Thank you for taking time to come for your Medicare Wellness Visit. I appreciate your ongoing commitment to your health goals. Please review the following plan we discussed and let me know if I can assist you in the future.   These are the goals we discussed: Goals    . Follow up with Primary Care Provider     As needed       This is a list of the screening recommended for you and due dates:  Health Maintenance  Topic Date Due  . Flu Shot  07/25/2019*  . Tetanus Vaccine  05/24/2020*  .  Hepatitis C: One time screening is recommended by Center for Disease Control  (CDC) for  adults born from [birth year] through 1966.   05/24/2020*  . Pneumonia vaccines (2 of 2 - PPSV23) 12/07/2019  . Mammogram  Discontinued  . DEXA scan (bone density measurement)  Discontinued  . Colon Cancer Screening  Discontinued  *Topic was postponed. The date shown is not the original due date.

## 2019-05-29 ENCOUNTER — Ambulatory Visit (INDEPENDENT_AMBULATORY_CARE_PROVIDER_SITE_OTHER): Payer: Medicare Other

## 2019-05-29 ENCOUNTER — Other Ambulatory Visit: Payer: Self-pay

## 2019-05-29 VITALS — Temp 97.1°F

## 2019-05-29 DIAGNOSIS — E538 Deficiency of other specified B group vitamins: Secondary | ICD-10-CM | POA: Diagnosis not present

## 2019-05-29 MED ORDER — CYANOCOBALAMIN 1000 MCG/ML IJ SOLN
1000.0000 ug | Freq: Once | INTRAMUSCULAR | Status: AC
  Administered 2019-05-29: 1000 ug via INTRAMUSCULAR

## 2019-05-29 NOTE — Progress Notes (Addendum)
Debra Barr presents today for injection per MD orders. B12 injection administered IM in left Upper Arm. Administration without incident. Patient tolerated well.  Reviewed.  Dr Lorin Picket

## 2019-06-27 ENCOUNTER — Other Ambulatory Visit: Payer: Self-pay

## 2019-06-27 ENCOUNTER — Ambulatory Visit (INDEPENDENT_AMBULATORY_CARE_PROVIDER_SITE_OTHER): Payer: Medicare Other

## 2019-06-27 DIAGNOSIS — E538 Deficiency of other specified B group vitamins: Secondary | ICD-10-CM

## 2019-06-27 MED ORDER — CYANOCOBALAMIN 1000 MCG/ML IJ SOLN
1000.0000 ug | Freq: Once | INTRAMUSCULAR | Status: AC
  Administered 2019-06-27: 1000 ug via INTRAMUSCULAR

## 2019-06-27 NOTE — Progress Notes (Addendum)
Patient presented for B 12 injection to left deltoid, patient voiced no concerns nor showed any signs of distress during injection.  Reviewed.  Dr Scott 

## 2019-07-31 ENCOUNTER — Ambulatory Visit (INDEPENDENT_AMBULATORY_CARE_PROVIDER_SITE_OTHER): Payer: Medicare Other

## 2019-07-31 ENCOUNTER — Other Ambulatory Visit: Payer: Self-pay

## 2019-07-31 DIAGNOSIS — E538 Deficiency of other specified B group vitamins: Secondary | ICD-10-CM | POA: Diagnosis not present

## 2019-07-31 MED ORDER — CYANOCOBALAMIN 1000 MCG/ML IJ SOLN
1000.0000 ug | Freq: Once | INTRAMUSCULAR | Status: AC
  Administered 2019-07-31: 1000 ug via INTRAMUSCULAR

## 2019-07-31 NOTE — Progress Notes (Addendum)
Patient presented for B 12 injection to right deltoid, patient voiced no concerns nor showed any signs of distress during injection.  Reviewed.  Dr Scott 

## 2019-08-30 ENCOUNTER — Other Ambulatory Visit: Payer: Self-pay

## 2019-08-30 ENCOUNTER — Ambulatory Visit (INDEPENDENT_AMBULATORY_CARE_PROVIDER_SITE_OTHER): Payer: Medicare Other

## 2019-08-30 DIAGNOSIS — E538 Deficiency of other specified B group vitamins: Secondary | ICD-10-CM | POA: Diagnosis not present

## 2019-08-30 MED ORDER — CYANOCOBALAMIN 1000 MCG/ML IJ SOLN
1000.0000 ug | Freq: Once | INTRAMUSCULAR | Status: AC
  Administered 2019-08-30: 1000 ug via INTRAMUSCULAR

## 2019-08-30 NOTE — Progress Notes (Addendum)
Patient presented for B 12 injection to right deltoid, patient voiced no concerns nor showed any signs of distress during injection.  Reviewed.  Dr Scott 

## 2019-09-07 ENCOUNTER — Other Ambulatory Visit: Payer: Self-pay | Admitting: Internal Medicine

## 2019-09-19 ENCOUNTER — Telehealth: Payer: Self-pay | Admitting: Internal Medicine

## 2019-09-19 NOTE — Telephone Encounter (Signed)
Patient is going out of town and was wondering if she could take a B12 supplement because she is going to miss her June b12 injection. She thinks it will be sometime in July when she returns. Please call her.

## 2019-09-20 NOTE — Telephone Encounter (Signed)
Ok to start oral B12 q day.

## 2019-09-20 NOTE — Telephone Encounter (Signed)
Are you ok for her to do oral b12 while she is out of town?

## 2019-09-20 NOTE — Telephone Encounter (Signed)
Patient aware.

## 2019-10-02 ENCOUNTER — Ambulatory Visit: Payer: Medicare Other

## 2019-11-07 ENCOUNTER — Ambulatory Visit (INDEPENDENT_AMBULATORY_CARE_PROVIDER_SITE_OTHER): Payer: Medicare Other

## 2019-11-07 ENCOUNTER — Other Ambulatory Visit: Payer: Self-pay

## 2019-11-07 DIAGNOSIS — E538 Deficiency of other specified B group vitamins: Secondary | ICD-10-CM | POA: Diagnosis not present

## 2019-11-07 MED ORDER — CYANOCOBALAMIN 1000 MCG/ML IJ SOLN
1000.0000 ug | Freq: Once | INTRAMUSCULAR | Status: AC
  Administered 2019-11-07: 1000 ug via INTRAMUSCULAR

## 2019-11-07 NOTE — Progress Notes (Signed)
Patient presented for B 12 injection to left deltoid, patient voiced no concerns nor showed any signs of distress during injection. 

## 2019-12-12 ENCOUNTER — Other Ambulatory Visit: Payer: Self-pay

## 2019-12-12 ENCOUNTER — Ambulatory Visit (INDEPENDENT_AMBULATORY_CARE_PROVIDER_SITE_OTHER): Payer: Medicare Other

## 2019-12-12 DIAGNOSIS — E538 Deficiency of other specified B group vitamins: Secondary | ICD-10-CM | POA: Diagnosis not present

## 2019-12-12 MED ORDER — CYANOCOBALAMIN 1000 MCG/ML IJ SOLN
1000.0000 ug | Freq: Once | INTRAMUSCULAR | Status: AC
  Administered 2019-12-12: 1000 ug via INTRAMUSCULAR

## 2019-12-12 NOTE — Progress Notes (Addendum)
Patient presented for B 12 injection to right deltoid, patient voiced no concerns nor showed any signs of distress during injection.  Reviewed.  Dr Scott 

## 2019-12-30 ENCOUNTER — Other Ambulatory Visit: Payer: Self-pay | Admitting: Internal Medicine

## 2020-01-15 ENCOUNTER — Ambulatory Visit (INDEPENDENT_AMBULATORY_CARE_PROVIDER_SITE_OTHER): Payer: Medicare Other

## 2020-01-15 ENCOUNTER — Other Ambulatory Visit: Payer: Self-pay

## 2020-01-15 DIAGNOSIS — E538 Deficiency of other specified B group vitamins: Secondary | ICD-10-CM | POA: Diagnosis not present

## 2020-01-15 MED ORDER — CYANOCOBALAMIN 1000 MCG/ML IJ SOLN
1000.0000 ug | Freq: Once | INTRAMUSCULAR | Status: AC
  Administered 2020-01-15: 1000 ug via INTRAMUSCULAR

## 2020-01-15 NOTE — Progress Notes (Addendum)
Patient presented for B 12 injection to left deltoid, patient voiced no concerns nor showed any signs of distress during injection.  Reviewed.  Dr Scott 

## 2020-02-19 ENCOUNTER — Ambulatory Visit (INDEPENDENT_AMBULATORY_CARE_PROVIDER_SITE_OTHER): Payer: Medicare Other

## 2020-02-19 ENCOUNTER — Other Ambulatory Visit: Payer: Self-pay

## 2020-02-19 DIAGNOSIS — E538 Deficiency of other specified B group vitamins: Secondary | ICD-10-CM | POA: Diagnosis not present

## 2020-02-19 MED ORDER — CYANOCOBALAMIN 1000 MCG/ML IJ SOLN
1000.0000 ug | Freq: Once | INTRAMUSCULAR | Status: AC
  Administered 2020-02-19: 1000 ug via INTRAMUSCULAR

## 2020-02-19 NOTE — Progress Notes (Addendum)
Patient presented for B 12 injection to right deltoid, patient voiced no concerns nor showed any signs of distress during injection.  Reviewed.  Dr Scott 

## 2020-03-03 ENCOUNTER — Telehealth: Payer: Self-pay | Admitting: Internal Medicine

## 2020-03-03 ENCOUNTER — Other Ambulatory Visit: Payer: Self-pay | Admitting: Internal Medicine

## 2020-03-03 MED ORDER — AMLODIPINE BESYLATE 10 MG PO TABS
10.0000 mg | ORAL_TABLET | Freq: Every day | ORAL | 0 refills | Status: DC
Start: 2020-03-03 — End: 2020-10-09

## 2020-03-03 NOTE — Telephone Encounter (Signed)
No Office Visit since 8/20 please advise to refill?

## 2020-03-03 NOTE — Telephone Encounter (Signed)
Patient called in wanted to know if she could get 5 pills because she forgot her  med[place] sent to [facility] [phone] for amLODipine (NORVASC) 10 MG tablet

## 2020-03-04 ENCOUNTER — Telehealth: Payer: Self-pay | Admitting: Internal Medicine

## 2020-03-04 NOTE — Telephone Encounter (Signed)
I refilled amlodipine x 1.  Needs a f/u appt scheduled.  Message sent to [facility].

## 2020-03-04 NOTE — Telephone Encounter (Signed)
Lm to call back and schedule a follow up appt

## 2020-03-04 NOTE — Telephone Encounter (Signed)
Overdue f/u appt.  Need to schedule f/u/physical.  rx refilled x 1.  Will need appt prior to any other refills.

## 2020-03-25 ENCOUNTER — Other Ambulatory Visit: Payer: Self-pay

## 2020-03-25 ENCOUNTER — Ambulatory Visit (INDEPENDENT_AMBULATORY_CARE_PROVIDER_SITE_OTHER): Payer: Medicare Other

## 2020-03-25 DIAGNOSIS — E538 Deficiency of other specified B group vitamins: Secondary | ICD-10-CM

## 2020-03-25 MED ORDER — CYANOCOBALAMIN 1000 MCG/ML IJ SOLN
1000.0000 ug | Freq: Once | INTRAMUSCULAR | Status: AC
  Administered 2020-03-25: 1000 ug via INTRAMUSCULAR

## 2020-03-25 NOTE — Progress Notes (Addendum)
Patient presented for B 12 injection to left deltoid, patient voiced no concerns nor showed any signs of distress during injection.  Reviewed.  Dr Scott 

## 2020-04-03 ENCOUNTER — Other Ambulatory Visit: Payer: Self-pay | Admitting: Internal Medicine

## 2020-04-24 ENCOUNTER — Ambulatory Visit (INDEPENDENT_AMBULATORY_CARE_PROVIDER_SITE_OTHER): Payer: Medicare Other

## 2020-04-24 ENCOUNTER — Other Ambulatory Visit: Payer: Self-pay

## 2020-04-24 DIAGNOSIS — E538 Deficiency of other specified B group vitamins: Secondary | ICD-10-CM | POA: Diagnosis not present

## 2020-04-24 MED ORDER — CYANOCOBALAMIN 1000 MCG/ML IJ SOLN
1000.0000 ug | Freq: Once | INTRAMUSCULAR | Status: AC
  Administered 2020-04-24: 14:00:00 1000 ug via INTRAMUSCULAR

## 2020-04-24 NOTE — Progress Notes (Signed)
Patient presented for B 12 injection to right deltoid, patient voiced no concerns nor showed any signs of distress during injection. 

## 2020-05-26 ENCOUNTER — Ambulatory Visit (INDEPENDENT_AMBULATORY_CARE_PROVIDER_SITE_OTHER): Payer: Medicare Other

## 2020-05-26 ENCOUNTER — Other Ambulatory Visit: Payer: Self-pay

## 2020-05-26 DIAGNOSIS — E538 Deficiency of other specified B group vitamins: Secondary | ICD-10-CM

## 2020-05-26 MED ORDER — CYANOCOBALAMIN 1000 MCG/ML IJ SOLN
1000.0000 ug | Freq: Once | INTRAMUSCULAR | Status: AC
  Administered 2020-05-26: 1000 ug via INTRAMUSCULAR

## 2020-05-26 NOTE — Progress Notes (Signed)
Patient presented for B 12 injection to left deltoid, patient voiced no concerns nor showed any signs of distress during injection. 

## 2020-05-27 ENCOUNTER — Ambulatory Visit (INDEPENDENT_AMBULATORY_CARE_PROVIDER_SITE_OTHER): Payer: Medicare Other

## 2020-05-27 VITALS — Ht 63.5 in | Wt 179.0 lb

## 2020-05-27 DIAGNOSIS — Z0000 Encounter for general adult medical examination without abnormal findings: Secondary | ICD-10-CM | POA: Diagnosis not present

## 2020-05-27 NOTE — Patient Instructions (Addendum)
Debra Barr , Thank you for taking time to come for your Medicare Wellness Visit. I appreciate your ongoing commitment to your health goals. Please review the following plan we discussed and let me know if I can assist you in the future.   These are the goals we discussed: Goals    . Follow up with Primary Care Provider     As needed       This is a list of the screening recommended for you and due dates:  Health Maintenance  Topic Date Due  .  Hepatitis C: One time screening is recommended by Center for Disease Control  (CDC) for  adults born from [birth year] through 1966.   Never done  . COVID-19 Vaccine (1) Never done  . Pneumonia vaccines (2 of 2 - PPSV23) 12/07/2019  . Flu Shot  07/24/2020*  . Tetanus Vaccine  05/27/2021*  . DEXA scan (bone density measurement)  Discontinued  . Colon Cancer Screening  Discontinued  *Topic was postponed. The date shown is not the original due date.    Immunizations Immunization History  Administered Date(s) Administered  . Pneumococcal Conjugate-13 12/07/2018  . Tdap 09/06/2008   Keep all routine maintenance appointments.   Nurse visit- 06/26/20 @ 2:00-B12 injection.   Encouraged patient to schedule annual with pcp. No labs since 11/2018. Patient declined today, agrees to call back and schedule.    Advanced directives: not yet completed  Conditions/risks identified: none new  Follow up in one year for your annual wellness visit.   Preventive Care 75 Years and Older, Female Preventive care refers to lifestyle choices and visits with your health care provider that can promote health and wellness. What does preventive care include?  A yearly physical exam. This is also called an annual well check.  Dental exams once or twice a year.  Routine eye exams. Ask your health care provider how often you should have your eyes checked.  Personal lifestyle choices, including:  Daily care of your teeth and gums.  Regular physical  activity.  Eating a healthy diet.  Avoiding tobacco and drug use.  Limiting alcohol use.  Practicing safe sex.  Taking low-dose aspirin every day.  Taking vitamin and mineral supplements as recommended by your health care provider. What happens during an annual well check? The services and screenings done by your health care provider during your annual well check will depend on your age, overall health, lifestyle risk factors, and family history of disease. Counseling  Your health care provider may ask you questions about your:  Alcohol use.  Tobacco use.  Drug use.  Emotional well-being.  Home and relationship well-being.  Sexual activity.  Eating habits.  History of falls.  Memory and ability to understand (cognition).  Work and work Astronomer.  Reproductive health. Screening  You may have the following tests or measurements:  Height, weight, and BMI.  Blood pressure.  Lipid and cholesterol levels. These may be checked every 5 years, or more frequently if you are over 52 years old.  Skin check.  Lung cancer screening. You may have this screening every year starting at age 46 if you have a 30-pack-year history of smoking and currently smoke or have quit within the past 15 years.  Fecal occult blood test (FOBT) of the stool. You may have this test every year starting at age 43.  Flexible sigmoidoscopy or colonoscopy. You may have a sigmoidoscopy every 5 years or a colonoscopy every 10 years starting at age 14.  Hepatitis C blood test.  Hepatitis B blood test.  Sexually transmitted disease (STD) testing.  Diabetes screening. This is done by checking your blood sugar (glucose) after you have not eaten for a while (fasting). You may have this done every 1-3 years.  Bone density scan. This is done to screen for osteoporosis. You may have this done starting at age 47.  Mammogram. This may be done every 1-2 years. Talk to your health care provider about  how often you should have regular mammograms. Talk with your health care provider about your test results, treatment options, and if necessary, the need for more tests. Vaccines  Your health care provider may recommend certain vaccines, such as:  Influenza vaccine. This is recommended every year.  Tetanus, diphtheria, and acellular pertussis (Tdap, Td) vaccine. You may need a Td booster every 10 years.  Zoster vaccine. You may need this after age 57.  Pneumococcal 13-valent conjugate (PCV13) vaccine. One dose is recommended after age 81.  Pneumococcal polysaccharide (PPSV23) vaccine. One dose is recommended after age 73. Talk to your health care provider about which screenings and vaccines you need and how often you need them. This information is not intended to replace advice given to you by your health care provider. Make sure you discuss any questions you have with your health care provider. Document Released: 05/09/2015 Document Revised: 12/31/2015 Document Reviewed: 02/11/2015 Elsevier Interactive Patient Education  2017 [place] Prevention in the Home Falls can cause injuries. They can happen to people of all ages. There are many things you can do to make your home safe and to help prevent falls. What can I do on the outside of my home?  Regularly fix the edges of walkways and driveways and fix any cracks.  Remove anything that might make you trip as you walk through a door, such as a raised step or threshold.  Trim any bushes or trees on the path to your home.  Use bright outdoor lighting.  Clear any walking paths of anything that might make someone trip, such as rocks or tools.  Regularly check to see if handrails are loose or broken. Make sure that both sides of any steps have handrails.  Any raised decks and porches should have guardrails on the edges.  Have any leaves, snow, or ice cleared regularly.  Use sand or salt on walking paths during  winter.  Clean up any spills in your garage right away. This includes oil or grease spills. What can I do in the bathroom?  Use night lights.  Install grab bars by the toilet and in the tub and shower. Do not use towel bars as grab bars.  Use non-skid mats or decals in the tub or shower.  If you need to sit down in the shower, use a plastic, non-slip stool.  Keep the floor dry. Clean up any water that spills on the floor as soon as it happens.  Remove soap buildup in the tub or shower regularly.  Attach bath mats securely with double-sided non-slip rug tape.  Do not have throw rugs and other things on the floor that can make you trip. What can I do in the bedroom?  Use night lights.  Make sure that you have a light by your bed that is easy to reach.  Do not use any sheets or blankets that are too big for your bed. They should not hang down onto the floor.  Have a firm chair that has side  arms. You can use this for support while you get dressed.  Do not have throw rugs and other things on the floor that can make you trip. What can I do in the kitchen?  Clean up any spills right away.  Avoid walking on wet floors.  Keep items that you use a lot in easy-to-reach places.  If you need to reach something above you, use a strong step stool that has a grab bar.  Keep electrical cords out of the way.  Do not use floor polish or wax that makes floors slippery. If you must use wax, use non-skid floor wax.  Do not have throw rugs and other things on the floor that can make you trip. What can I do with my stairs?  Do not leave any items on the stairs.  Make sure that there are handrails on both sides of the stairs and use them. Fix handrails that are broken or loose. Make sure that handrails are as long as the stairways.  Check any carpeting to make sure that it is firmly attached to the stairs. Fix any carpet that is loose or worn.  Avoid having throw rugs at the top or  bottom of the stairs. If you do have throw rugs, attach them to the floor with carpet tape.  Make sure that you have a light switch at the top of the stairs and the bottom of the stairs. If you do not have them, ask someone to add them for you. What else can I do to help prevent falls?  Wear shoes that:  Do not have high heels.  Have rubber bottoms.  Are comfortable and fit you well.  Are closed at the toe. Do not wear sandals.  If you use a stepladder:  Make sure that it is fully opened. Do not climb a closed stepladder.  Make sure that both sides of the stepladder are locked into place.  Ask someone to hold it for you, if possible.  Clearly mark and make sure that you can see:  Any grab bars or handrails.  First and last steps.  Where the edge of each step is.  Use tools that help you move around (mobility aids) if they are needed. These include:  Canes.  Walkers.  Scooters.  Crutches.  Turn on the lights when you go into a dark area. Replace any light bulbs as soon as they burn out.  Set up your furniture so you have a clear path. Avoid moving your furniture around.  If any of your floors are uneven, fix them.  If there are any pets around you, be aware of where they are.  Review your medicines with your doctor. Some medicines can make you feel dizzy. This can increase your chance of falling. Ask your doctor what other things that you can do to help prevent falls. This information is not intended to replace advice given to you by your health care provider. Make sure you discuss any questions you have with your health care provider. Document Released: 02/06/2009 Document Revised: 09/18/2015 Document Reviewed: 05/17/2014 Elsevier Interactive Patient Education  2017 [employer].

## 2020-05-27 NOTE — Progress Notes (Signed)
Subjective:   Debra Barr is a 75 y.o. female who presents for Medicare Annual (Subsequent) preventive examination.  Review of Systems    No ROS.  Medicare Wellness Virtual Visit.   Cardiac Risk Factors include: advanced age (>21men, >50 women);hypertension     Objective:    Today's Vitals   05/27/20 1033  Weight: 179 lb (81.2 kg)  Height: 5' 3.5" (1.613 m)   Body mass index is 31.21 kg/m.  Advanced Directives 05/27/2020 05/25/2019  Does Patient Have a Medical Advance Directive? No Yes  Type of Advance Directive - Living will  Does patient want to make changes to medical advance directive? - No - Patient declined  Would patient like information on creating a medical advance directive? No - Patient declined -    Current Medications (verified) Outpatient Encounter Medications as of 05/27/2020  Medication Sig  . amLODipine (NORVASC) 10 MG tablet TAKE ONE TABLET BY MOUTH DAILY  . amLODipine (NORVASC) 10 MG tablet Take 1 tablet (10 mg total) by mouth daily.  Marland Kitchen amLODipine (NORVASC) 10 MG tablet TAKE ONE TABLET BY MOUTH DAILY  . Ascorbic Acid (VITAMIN C PO) Take by mouth.  . Coenzyme Q10 (COQ10 PO) Take by mouth.  . Omega-3 Fatty Acids (FISH OIL PO) Take by mouth.   No facility-administered encounter medications on file as of 05/27/2020.    Allergies (verified) Patient has no allergy information on record.   History: Past Medical History:  Diagnosis Date  . Arthritis    Past Surgical History:  Procedure Laterality Date  . NO PAST SURGERIES     Family History  Problem Relation Age of Onset  . Arthritis Mother   . Early death Father   . Hyperlipidemia Father   . Hypertension Brother   . Kidney disease Brother    Social History   Socioeconomic History  . Marital status: Married    Spouse name: Not on file  . Number of children: Not on file  . Years of education: Not on file  . Highest education level: Not on file  Occupational History  . Not on file   Tobacco Use  . Smoking status: Never Smoker  . Smokeless tobacco: Never Used  Vaping Use  . Vaping Use: Never used  Substance and Sexual Activity  . Alcohol use: Yes  . Drug use: Never  . Sexual activity: Not Currently  Other Topics Concern  . Not on file  Social History Narrative  . Not on file   Social Determinants of Health   Financial Resource Strain: Low Risk   . Difficulty of Paying Living Expenses: Not hard at all  Food Insecurity: No Food Insecurity  . Worried About Debra Barr in the Last Year: Never true  . Ran Out of Food in the Last Year: Never true  Transportation Needs: No Transportation Needs  . Lack of Transportation (Medical): No  . Lack of Transportation (Non-Medical): No  Physical Activity: Not on file  Stress: No Stress Concern Present  . Feeling of Stress : Not at all  Social Connections: Unknown  . Frequency of Communication with Friends and Family: More than three times a week  . Frequency of Social Gatherings with Friends and Family: More than three times a week  . Attends Religious Services: Not on file  . Active Member of Clubs or Organizations: Not on file  . Attends Banker Meetings: Not on file  . Marital Status: Married    Tobacco Counseling Counseling  given: Not Answered   Clinical Intake:  Pre-visit preparation completed: Yes        Diabetes: No  How often do you need to have someone help you when you read instructions, pamphlets, or other written materials from your doctor or pharmacy?: 1 - Never   Interpreter Needed?: No      Activities of Daily Living In your present state of health, do you have any difficulty performing the following activities: 05/27/2020  Hearing? N  Vision? N  Difficulty concentrating or making decisions? N  Walking or climbing stairs? N  Dressing or bathing? N  Doing errands, shopping? N  Preparing Food and eating ? N  Using the Toilet? N  In the past six months, have you  accidently leaked urine? N  Do you have problems with loss of bowel control? N  Managing your Medications? N  Managing your Finances? N  Housekeeping or managing your Housekeeping? N  Some recent data might be hidden    Patient Care Team: Dale Alamo, MD as PCP - General (Internal Medicine)  Indicate any recent Medical Services you may have received from other than [facility] providers in the past year (date may be approximate).     Assessment:   This is a routine wellness examination for Debra Barr.  I connected with Debra Barr today by telephone and verified that I am speaking with the correct person using two identifiers. Location patient: home Location provider: work Persons participating in the virtual visit: patient, Engineer, civil (consulting).    I discussed the limitations, risks, security and privacy concerns of performing an evaluation and management service by telephone and the availability of in person appointments. The patient expressed understanding and verbally consented to this telephonic visit.    Interactive audio and video telecommunications were attempted between this provider and patient, however failed, due to patient having technical difficulties OR patient did not have access to video capability.  We continued and completed visit with audio only.  Some vital signs may be absent or patient reported.   Hearing/Vision screen  Hearing Screening   125Hz  250Hz  500Hz  1000Hz  2000Hz  3000Hz  4000Hz  6000Hz  8000Hz   Right ear:           Left ear:           Comments: Patient is able to hear conversational tones without difficulty.  No issues reported.  Vision Screening Comments: Visual acuity not assessed, virtual visit.       Dietary issues and exercise activities discussed: Current Exercise Habits: Home exercise routine  Healthy diet  Good water intake  Goals    . Follow up with Primary Care Provider     As needed      Depression Screen PHQ 2/9 Scores 05/27/2020 05/25/2019 11/13/2018   PHQ - 2 Score 0 0 0    Fall Risk Fall Risk  05/27/2020 05/25/2019  Falls in the past year? 0 0  Number falls in past yr: 0 -  Injury with Fall? 0 -  Follow up - Falls evaluation completed    FALL RISK PREVENTION PERTAINING TO THE HOME: Handrails in use when climbing stairs? Yes Home free of loose throw rugs in walkways, pet beds, electrical cords, etc? Yes  Adequate lighting in your home to reduce risk of falls? Yes   ASSISTIVE DEVICES UTILIZED TO PREVENT FALLS: Use of a cane, walker or w/c? No   TIMED UP AND GO: Was the test performed? No .  Virtual visit.   Cognitive Function:  Patient is alert.  Memory change  Enjoys reading   6CIT Screen 05/27/2020 05/25/2019  What Year? - 0 points  What month? - 0 points  What time? - 0 points  Count back from 20 - 0 points  Months in reverse 0 points 0 points  Repeat phrase - 0 points  Total Score - 0    Immunizations Immunization History  Administered Date(s) Administered  . Pneumococcal Conjugate-13 12/07/2018  . Tdap 09/06/2008    TDAP status: Due, Education has been provided regarding the importance of this vaccine. Advised may receive this vaccine at local pharmacy or Health Dept. Aware to provide a copy of the vaccination record if obtained from local pharmacy or Health Dept. Verbalized acceptance and understanding. Deferred.   Health Maintenance Health Maintenance  Topic Date Due  . Hepatitis C Screening  Never done  . COVID-19 Vaccine (1) Never done  . PNA vac Low Risk Adult (2 of 2 - PPSV23) 12/07/2019  . INFLUENZA VACCINE  07/24/2020 (Originally 11/25/2019)  . TETANUS/TDAP  05/27/2021 (Originally 09/07/2018)  . DEXA SCAN  Discontinued  . COLONOSCOPY (Pts 45-16yrs Insurance coverage will need to be confirmed)  Discontinued   Colorectal cancer screening: No longer required.   Mammogram- discontinued per chart.   Bone density- discontinued per chart.  Covid vaccines/booster- Completed. Agrees to update immunization  record.   Lung Cancer Screening: (Low Dose CT Chest recommended if Age 58-80 years, 30 pack-year currently smoking OR have quit w/in 15years.) does not qualify.   Hepatitis C Screening: deferred.   Dental Screening: Recommended annual dental exams for proper oral hygiene.  Community Resource Referral / Chronic Care Management: CRR required this visit?  No   CCM required this visit?  No      Plan:   Keep all routine maintenance appointments.   Nurse visit- 06/26/20 @ 2:00-B12 injection.   Encouraged patient to schedule annual with pcp. No labs since 11/2018. Patient declined today, agrees to call back and schedule.    I have personally reviewed and noted the following in the patient's chart:   . Medical and social history . Use of alcohol, tobacco or illicit drugs  . Current medications and supplements . Functional ability and status . Nutritional status . Physical activity . Advanced directives . List of other physicians . Hospitalizations, surgeries, and ER visits in previous 12 months . Vitals . Screenings to include cognitive, depression, and falls . Referrals and appointments  In addition, I have reviewed and discussed with patient certain preventive protocols, quality metrics, and best practice recommendations. A written personalized care plan for preventive services as well as general preventive health recommendations were provided to patient via mychart.     Ashok Pall, LPN   0/04/930

## 2020-06-26 ENCOUNTER — Ambulatory Visit (INDEPENDENT_AMBULATORY_CARE_PROVIDER_SITE_OTHER): Payer: Medicare Other

## 2020-06-26 ENCOUNTER — Other Ambulatory Visit: Payer: Self-pay

## 2020-06-26 DIAGNOSIS — E538 Deficiency of other specified B group vitamins: Secondary | ICD-10-CM | POA: Diagnosis not present

## 2020-06-26 MED ORDER — CYANOCOBALAMIN 1000 MCG/ML IJ SOLN
1000.0000 ug | Freq: Once | INTRAMUSCULAR | Status: AC
  Administered 2020-06-26: 1000 ug via INTRAMUSCULAR

## 2020-06-26 NOTE — Progress Notes (Signed)
Patient presented for B 12 injection to left deltoid, patient voiced no concerns nor showed any signs of distress during injection. 

## 2020-07-28 ENCOUNTER — Ambulatory Visit (INDEPENDENT_AMBULATORY_CARE_PROVIDER_SITE_OTHER): Payer: Medicare Other

## 2020-07-28 ENCOUNTER — Other Ambulatory Visit: Payer: Self-pay

## 2020-07-28 DIAGNOSIS — E538 Deficiency of other specified B group vitamins: Secondary | ICD-10-CM | POA: Diagnosis not present

## 2020-07-28 MED ORDER — CYANOCOBALAMIN 1000 MCG/ML IJ SOLN
1000.0000 ug | Freq: Once | INTRAMUSCULAR | Status: AC
  Administered 2020-07-28: 1000 ug via INTRAMUSCULAR

## 2020-07-28 NOTE — Progress Notes (Signed)
Patient presented for B 12 injection to right deltoid, patient voiced no concerns nor showed any signs of distress during injection. 

## 2020-08-27 ENCOUNTER — Ambulatory Visit: Payer: Medicare Other

## 2020-09-08 ENCOUNTER — Other Ambulatory Visit: Payer: Self-pay

## 2020-09-08 ENCOUNTER — Ambulatory Visit (INDEPENDENT_AMBULATORY_CARE_PROVIDER_SITE_OTHER): Payer: Medicare Other

## 2020-09-08 DIAGNOSIS — E538 Deficiency of other specified B group vitamins: Secondary | ICD-10-CM | POA: Diagnosis not present

## 2020-09-08 MED ORDER — CYANOCOBALAMIN 1000 MCG/ML IJ SOLN
1000.0000 ug | Freq: Once | INTRAMUSCULAR | Status: AC
  Administered 2020-09-08: 1000 ug via INTRAMUSCULAR

## 2020-09-08 NOTE — Progress Notes (Signed)
Patient presented for B 12 injection to left deltoid, patient voiced no concerns nor showed any signs of distress during injection. 

## 2020-10-07 ENCOUNTER — Other Ambulatory Visit: Payer: Self-pay | Admitting: Internal Medicine

## 2020-10-08 NOTE — Telephone Encounter (Signed)
Attempted to contact patient and schedule an appointment. Was connected to an automated message to set up a free 100 dollar Rebate. Could not leave a message to call back.

## 2020-10-09 ENCOUNTER — Ambulatory Visit (INDEPENDENT_AMBULATORY_CARE_PROVIDER_SITE_OTHER): Payer: Medicare Other

## 2020-10-09 ENCOUNTER — Other Ambulatory Visit: Payer: Self-pay

## 2020-10-09 ENCOUNTER — Telehealth: Payer: Self-pay | Admitting: Internal Medicine

## 2020-10-09 DIAGNOSIS — E538 Deficiency of other specified B group vitamins: Secondary | ICD-10-CM | POA: Diagnosis not present

## 2020-10-09 MED ORDER — AMLODIPINE BESYLATE 10 MG PO TABS: 10.0000 mg | ORAL_TABLET | Freq: Every day | ORAL | 1 refills | Status: DC

## 2020-10-09 MED ORDER — CYANOCOBALAMIN 1000 MCG/ML IJ SOLN
1000.0000 ug | Freq: Once | INTRAMUSCULAR | Status: AC
  Administered 2020-10-09: 15:00:00 1000 ug via INTRAMUSCULAR

## 2020-10-09 NOTE — Telephone Encounter (Signed)
Has been sent for patient & receipt confirmed by pharmacy.

## 2020-10-09 NOTE — Progress Notes (Addendum)
Patient presented for B 12 injection to right deltoid, patient voiced no concerns nor showed any signs of distress during injection. 

## 2020-10-09 NOTE — Telephone Encounter (Signed)
[facility] went to [facility] to pick up her amLODipine (NORVASC) 10 MG tablet. [facility] told her that they did not receive a prescription refill.

## 2020-11-03 ENCOUNTER — Telehealth: Payer: Self-pay | Admitting: Internal Medicine

## 2020-11-03 NOTE — Telephone Encounter (Signed)
Pt called to cancel her B12 for 11/10/20. She said that she will be out of town for several weeks and wanted to know the best supplement to take

## 2020-11-03 NOTE — Telephone Encounter (Signed)
Can take oral B12 q day.

## 2020-11-04 NOTE — Telephone Encounter (Signed)
Called and spoke to [place]. Andrew verbalized understanding and had no further questions.

## 2020-11-10 ENCOUNTER — Ambulatory Visit: Payer: Medicare Other

## 2020-11-27 ENCOUNTER — Ambulatory Visit (INDEPENDENT_AMBULATORY_CARE_PROVIDER_SITE_OTHER): Payer: Medicare Other | Admitting: Internal Medicine

## 2020-11-27 ENCOUNTER — Encounter: Payer: Self-pay | Admitting: Internal Medicine

## 2020-11-27 ENCOUNTER — Other Ambulatory Visit: Payer: Self-pay

## 2020-11-27 VITALS — BP 132/86 | HR 94 | Temp 98.6°F | Ht 63.5 in | Wt 165.4 lb

## 2020-11-27 DIAGNOSIS — E538 Deficiency of other specified B group vitamins: Secondary | ICD-10-CM | POA: Insufficient documentation

## 2020-11-27 DIAGNOSIS — R413 Other amnesia: Secondary | ICD-10-CM | POA: Diagnosis not present

## 2020-11-27 DIAGNOSIS — I10 Essential (primary) hypertension: Secondary | ICD-10-CM | POA: Diagnosis not present

## 2020-11-27 DIAGNOSIS — E7800 Pure hypercholesterolemia, unspecified: Secondary | ICD-10-CM | POA: Diagnosis not present

## 2020-11-27 DIAGNOSIS — R748 Abnormal levels of other serum enzymes: Secondary | ICD-10-CM

## 2020-11-27 LAB — CBC WITH DIFFERENTIAL/PLATELET
Basophils Absolute: 0 10*3/uL (ref 0.0–0.1)
Basophils Relative: 0.6 % (ref 0.0–3.0)
Eosinophils Absolute: 0.1 10*3/uL (ref 0.0–0.7)
Eosinophils Relative: 1.2 % (ref 0.0–5.0)
HCT: 44.9 % (ref 36.0–46.0)
Hemoglobin: 14.5 g/dL (ref 12.0–15.0)
Lymphocytes Relative: 38.8 % (ref 12.0–46.0)
Lymphs Abs: 2.1 10*3/uL (ref 0.7–4.0)
MCHC: 32.2 g/dL (ref 30.0–36.0)
MCV: 90.7 fl (ref 78.0–100.0)
Monocytes Absolute: 0.3 10*3/uL (ref 0.1–1.0)
Monocytes Relative: 6.3 % (ref 3.0–12.0)
Neutro Abs: 2.9 10*3/uL (ref 1.4–7.7)
Neutrophils Relative %: 53.1 % (ref 43.0–77.0)
Platelets: 313 10*3/uL (ref 150.0–400.0)
RBC: 4.95 Mil/uL (ref 3.87–5.11)
RDW: 16.4 % — ABNORMAL HIGH (ref 11.5–15.5)
WBC: 5.5 10*3/uL (ref 4.0–10.5)

## 2020-11-27 LAB — LIPID PANEL
Cholesterol: 234 mg/dL — ABNORMAL HIGH (ref 0–200)
HDL: 57.9 mg/dL (ref >39.00)
LDL Cholesterol: 142 mg/dL — ABNORMAL HIGH (ref 0–99)
NonHDL: 175.86
Total CHOL/HDL Ratio: 4
Triglycerides: 169 mg/dL — ABNORMAL HIGH (ref 0.0–149.0)
VLDL: 33.8 mg/dL (ref 0.0–40.0)

## 2020-11-27 LAB — COMPREHENSIVE METABOLIC PANEL
ALT: 11 U/L (ref 0–35)
AST: 13 U/L (ref 0–37)
Albumin: 4.1 g/dL (ref 3.5–5.2)
Alkaline Phosphatase: 156 U/L — ABNORMAL HIGH (ref 39–117)
BUN: 11 mg/dL (ref 6–23)
CO2: 27 mEq/L (ref 19–32)
Calcium: 9.3 mg/dL (ref 8.4–10.5)
Chloride: 105 mEq/L (ref 96–112)
Creatinine, Ser: 0.62 mg/dL (ref 0.40–1.20)
GFR: 86.55 mL/min (ref >60.00)
Glucose, Bld: 87 mg/dL (ref 70–99)
Potassium: 4 mEq/L (ref 3.5–5.1)
Sodium: 141 mEq/L (ref 135–145)
Total Bilirubin: 0.5 mg/dL (ref 0.2–1.2)
Total Protein: 7.3 g/dL (ref 6.0–8.3)

## 2020-11-27 LAB — TSH: TSH: 1.19 u[IU]/mL (ref 0.35–5.50)

## 2020-11-27 LAB — VITAMIN B12: Vitamin B-12: 839 pg/mL (ref 211–911)

## 2020-11-27 MED ORDER — CYANOCOBALAMIN 1000 MCG/ML IJ SOLN
1000.0000 ug | Freq: Once | INTRAMUSCULAR | Status: AC
  Administered 2020-11-27: 1000 ug via INTRAMUSCULAR

## 2020-11-27 NOTE — Progress Notes (Signed)
Patient ID: Debra Barr, female   DOB: [birth year], 75 y.o.   MRN: [id number]   Subjective:    Patient ID: Debra Barr, female    DOB: Mar 28, 1946, 75 y.o.   MRN: [id number]  HPI This visit occurred during the SARS-CoV-2 public health emergency.  Safety protocols were in place, including screening questions prior to the visit, additional usage of staff PPE, and extensive cleaning of exam room while observing appropriate contact time as indicated for disinfecting solutions.   Patient here for a scheduled follow up.  Regarding her blood pressure and cholesterol.  Have not seen her in almost one year.  She is staying active.  Working in her garden.  No chest pain or sob reported. No abdominal pain or bowel change reported.  Discussed memory.  She does not rpeort any significant change.  Has been receiving B12 injections.    Past Medical History:  Diagnosis Date   Arthritis    Past Surgical History:  Procedure Laterality Date   NO PAST SURGERIES     Family History  Problem Relation Age of Onset   Arthritis Mother    Early death Father    Hyperlipidemia Father    Hypertension Brother    Kidney disease Brother    Social History   Socioeconomic History   Marital status: Married    Spouse name: Not on file   Number of children: Not on file   Years of education: Not on file   Highest education level: Not on file  Occupational History   Not on file  Tobacco Use   Smoking status: Never   Smokeless tobacco: Never  Vaping Use   Vaping Use: Never used  Substance and Sexual Activity   Alcohol use: Yes   Drug use: Never   Sexual activity: Not Currently  Other Topics Concern   Not on file  Social History Narrative   Not on file   Social Determinants of Health   Financial Resource Strain: Low Risk    Difficulty of Paying Living Expenses: Not hard at all  Food Insecurity: No Food Insecurity   Worried About Programme researcher, broadcasting/film/video in the Last Year: Never true   Ran Out of Food in  the Last Year: Never true  Transportation Needs: No Transportation Needs   Lack of Transportation (Medical): No   Lack of Transportation (Non-Medical): No  Physical Activity: Not on file  Stress: No Stress Concern Present   Feeling of Stress : Not at all  Social Connections: Unknown   Frequency of Communication with Friends and Family: More than three times a week   Frequency of Social Gatherings with Friends and Family: More than three times a week   Attends Religious Services: Not on Scientist, clinical (histocompatibility and immunogenetics) or Organizations: Not on file   Attends Banker Meetings: Not on file   Marital Status: Married    Review of Systems  Constitutional:  Negative for appetite change and unexpected weight change.  HENT:  Negative for congestion and sinus pressure.   Respiratory:  Negative for cough, chest tightness and shortness of breath.   Cardiovascular:  Negative for chest pain and palpitations.       Pedal and ankle edema - left > right.  Reports is stable.    Gastrointestinal:  Negative for abdominal pain, diarrhea, nausea and vomiting.  Genitourinary:  Negative for difficulty urinating and dysuria.  Musculoskeletal:  Negative for joint swelling and myalgias.  Skin:  Negative  for color change and rash.  Neurological:  Negative for dizziness, light-headedness and headaches.  Psychiatric/Behavioral:  Negative for agitation and dysphoric mood.       Objective:    Physical Exam Vitals reviewed.  Constitutional:      General: She is not in acute distress.    Appearance: Normal appearance.  HENT:     Head: Normocephalic and atraumatic.     Right Ear: External ear normal.     Left Ear: External ear normal.  Eyes:     General: No scleral icterus.       Right eye: No discharge.        Left eye: No discharge.     Conjunctiva/sclera: Conjunctivae normal.  Neck:     Thyroid: No thyromegaly.  Cardiovascular:     Rate and Rhythm: Normal rate and regular rhythm.   Pulmonary:     Effort: No respiratory distress.     Breath sounds: Normal breath sounds. No wheezing.  Abdominal:     General: Bowel sounds are normal.     Palpations: Abdomen is soft.     Tenderness: There is no abdominal tenderness.  Musculoskeletal:        General: No swelling or tenderness.     Cervical back: Neck supple. No tenderness.  Lymphadenopathy:     Cervical: No cervical adenopathy.  Skin:    Findings: No erythema or rash.  Neurological:     Mental Status: She is alert.  Psychiatric:        Mood and Affect: Mood normal.        Behavior: Behavior normal.    BP 132/86   Pulse 94   Temp 98.6 F (37 C)   Ht 5' 3.5" (1.613 m)   Wt 165 lb 6.4 oz (75 kg)   SpO2 95%   BMI 28.84 kg/m  Wt Readings from Last 3 Encounters:  11/27/20 165 lb 6.4 oz (75 kg)  05/27/20 179 lb (81.2 kg)  05/25/19 179 lb (81.2 kg)    Outpatient Encounter Medications as of 11/27/2020  Medication Sig   amLODipine (NORVASC) 10 MG tablet Take 1 tablet (10 mg total) by mouth daily.   [DISCONTINUED] Ascorbic Acid (VITAMIN C PO) Take by mouth. (Patient not taking: Reported on 11/27/2020)   [DISCONTINUED] Coenzyme Q10 (COQ10 PO) Take by mouth. (Patient not taking: Reported on 11/27/2020)   [DISCONTINUED] Omega-3 Fatty Acids (FISH OIL PO) Take by mouth. (Patient not taking: Reported on 11/27/2020)   [EXPIRED] cyanocobalamin ((VITAMIN B-12)) injection 1,000 mcg    No facility-administered encounter medications on file as of 11/27/2020.     Lab Results  Component Value Date   WBC 5.5 11/27/2020   HGB 14.5 11/27/2020   HCT 44.9 11/27/2020   PLT 313.0 11/27/2020   GLUCOSE 87 11/27/2020   CHOL 234 (H) 11/27/2020   TRIG 169.0 (H) 11/27/2020   HDL 57.90 11/27/2020   LDLCALC 142 (H) 11/27/2020   ALT 11 11/27/2020   AST 13 11/27/2020   NA 141 11/27/2020   K 4.0 11/27/2020   CL 105 11/27/2020   CREATININE 0.62 11/27/2020   BUN 11 11/27/2020   CO2 27 11/27/2020   TSH 1.19 11/27/2020         Assessment & Plan:   Problem List Items Addressed This Visit     B12 deficiency    Receiving B12 injections.  Check B12 level today.         Relevant Orders   Vitamin B12 (Completed)  Elevated alkaline phosphatase level    Recheck liver panel to confirm wnl.        Hypercholesterolemia - Primary    Low cholesterol diet and exercise.  Follow lipid panel.         Relevant Orders   TSH (Completed)   Lipid panel (Completed)   Hypertension, essential    On amlodipine 10mg  q day.  Blood pressure as outlined.  Follow pressures. Follow metabolic panel.  Some lower extremity swelling.  States has been present for a long time.  Elevate legs.  Discussed compression hose.  Hold on making changes in medication at this time.  Follow.        Relevant Orders   CBC with Differential/Platelet (Completed)   Comprehensive metabolic panel (Completed)   Memory change    Receiving B12 injections.  Check B12 level today.  If wnl, will change to oral B12.           , MD

## 2020-11-28 ENCOUNTER — Other Ambulatory Visit: Payer: Self-pay | Admitting: Internal Medicine

## 2020-11-28 DIAGNOSIS — R748 Abnormal levels of other serum enzymes: Secondary | ICD-10-CM

## 2020-11-28 DIAGNOSIS — E7800 Pure hypercholesterolemia, unspecified: Secondary | ICD-10-CM

## 2020-11-28 NOTE — Progress Notes (Signed)
Order placed for f/u labs.  

## 2020-12-01 ENCOUNTER — Encounter: Payer: Self-pay | Admitting: Internal Medicine

## 2020-12-01 ENCOUNTER — Telehealth: Payer: Self-pay

## 2020-12-01 MED ORDER — ROSUVASTATIN CALCIUM 5 MG PO TABS: 5.0000 mg | ORAL_TABLET | Freq: Every day | ORAL | 2 refills | Status: DC

## 2020-12-01 NOTE — Assessment & Plan Note (Signed)
Low cholesterol diet and exercise.  Follow lipid panel.   

## 2020-12-01 NOTE — Telephone Encounter (Signed)
Pt medication for Crestor sent to pharmacy for pt per provider request

## 2020-12-01 NOTE — Assessment & Plan Note (Signed)
Receiving B12 injections.  Check B12 level today.  If wnl, will change to oral B12.

## 2020-12-01 NOTE — Assessment & Plan Note (Signed)
On amlodipine 10mg  q day.  Blood pressure as outlined.  Follow pressures. Follow metabolic panel.  Some lower extremity swelling.  States has been present for a long time.  Elevate legs.  Discussed compression hose.  Hold on making changes in medication at this time.  Follow.

## 2020-12-01 NOTE — Assessment & Plan Note (Signed)
Receiving B12 injections.  Check B12 level today.   

## 2020-12-01 NOTE — Assessment & Plan Note (Signed)
Recheck liver panel to confirm wnl.  

## 2020-12-26 ENCOUNTER — Other Ambulatory Visit: Payer: Medicare Other

## 2020-12-30 ENCOUNTER — Ambulatory Visit: Payer: Medicare Other

## 2021-01-28 ENCOUNTER — Other Ambulatory Visit: Payer: Self-pay

## 2021-01-28 ENCOUNTER — Ambulatory Visit (INDEPENDENT_AMBULATORY_CARE_PROVIDER_SITE_OTHER): Payer: Medicare Other | Admitting: Internal Medicine

## 2021-01-28 VITALS — BP 118/78 | HR 83 | Temp 97.8°F | Resp 16 | Ht 64.0 in | Wt 161.0 lb

## 2021-01-28 DIAGNOSIS — E7800 Pure hypercholesterolemia, unspecified: Secondary | ICD-10-CM | POA: Diagnosis not present

## 2021-01-28 DIAGNOSIS — I10 Essential (primary) hypertension: Secondary | ICD-10-CM | POA: Diagnosis not present

## 2021-01-28 DIAGNOSIS — R413 Other amnesia: Secondary | ICD-10-CM

## 2021-01-28 DIAGNOSIS — E538 Deficiency of other specified B group vitamins: Secondary | ICD-10-CM

## 2021-01-28 DIAGNOSIS — R748 Abnormal levels of other serum enzymes: Secondary | ICD-10-CM

## 2021-01-28 MED ORDER — CYANOCOBALAMIN 1000 MCG/ML IJ SOLN
1000.0000 ug | Freq: Once | INTRAMUSCULAR | Status: AC
  Administered 2021-01-28: 1000 ug via INTRAMUSCULAR

## 2021-01-28 NOTE — Progress Notes (Signed)
Patient ID: Debra Barr, female   DOB: [birth year], 75 y.o.   MRN: [id number]   Subjective:    Patient ID: Debra Barr, female    DOB: Sep 12, 1945, 75 y.o.   MRN: [id number]  This visit occurred during the SARS-CoV-2 public health emergency.  Safety protocols were in place, including screening questions prior to the visit, additional usage of staff PPE, and extensive cleaning of exam room while observing appropriate contact time as indicated for disinfecting solutions.   Patient here for a scheduled follow up.    Chief Complaint  Patient presents with   Hypertension   Hyperlipidemia   .   HPI She reports she is doing relatively well.  Staying physically active.  No chest pain or sob reported.  No abdominal pain.  Bowels moving.  Weight is decreased.  She reports she has been watching her diet and staying active.  Receiving B12 injections.  Discussed lower extremity swelling.  Stable.     Past Medical History:  Diagnosis Date   Arthritis    Past Surgical History:  Procedure Laterality Date   NO PAST SURGERIES     Family History  Problem Relation Age of Onset   Arthritis Mother    Early death Father    Hyperlipidemia Father    Hypertension Brother    Kidney disease Brother    Social History   Socioeconomic History   Marital status: Married    Spouse name: Not on file   Number of children: Not on file   Years of education: Not on file   Highest education level: Not on file  Occupational History   Not on file  Tobacco Use   Smoking status: Never   Smokeless tobacco: Never  Vaping Use   Vaping Use: Never used  Substance and Sexual Activity   Alcohol use: Yes   Drug use: Never   Sexual activity: Not Currently  Other Topics Concern   Not on file  Social History Narrative   Not on file   Social Determinants of Health   Financial Resource Strain: Low Risk    Difficulty of Paying Living Expenses: Not hard at all  Food Insecurity: No Food Insecurity   Worried  About Programme researcher, broadcasting/film/video in the Last Year: Never true   Ran Out of Food in the Last Year: Never true  Transportation Needs: No Transportation Needs   Lack of Transportation (Medical): No   Lack of Transportation (Non-Medical): No  Physical Activity: Not on file  Stress: No Stress Concern Present   Feeling of Stress : Not at all  Social Connections: Unknown   Frequency of Communication with Friends and Family: More than three times a week   Frequency of Social Gatherings with Friends and Family: More than three times a week   Attends Religious Services: Not on Scientist, clinical (histocompatibility and immunogenetics) or Organizations: Not on file   Attends Banker Meetings: Not on file   Marital Status: Married     Review of Systems  Constitutional:  Negative for appetite change and unexpected weight change.  HENT:  Negative for congestion and sinus pressure.   Respiratory:  Negative for cough, chest tightness and shortness of breath.   Cardiovascular:  Negative for chest pain and palpitations.  Gastrointestinal:  Negative for abdominal pain, diarrhea, nausea and vomiting.  Genitourinary:  Negative for difficulty urinating and dysuria.  Musculoskeletal:  Negative for joint swelling and myalgias.  Skin:  Negative for color change  and rash.  Neurological:  Negative for dizziness, light-headedness and headaches.  Psychiatric/Behavioral:  Negative for agitation and dysphoric mood.       Objective:     BP 118/78   Pulse 83   Temp 97.8 F (36.6 C)   Resp 16   Ht 5\' 4"  (1.626 m)   Wt 161 lb (73 kg)   SpO2 98%   BMI 27.64 kg/m  Wt Readings from Last 3 Encounters:  01/28/21 161 lb (73 kg)  11/27/20 165 lb 6.4 oz (75 kg)  05/27/20 179 lb (81.2 kg)    Physical Exam Vitals reviewed.  Constitutional:      General: She is not in acute distress.    Appearance: Normal appearance.  HENT:     Head: Normocephalic and atraumatic.     Right Ear: External ear normal.     Left Ear: External ear  normal.  Eyes:     General: No scleral icterus.       Right eye: No discharge.        Left eye: No discharge.     Conjunctiva/sclera: Conjunctivae normal.  Neck:     Thyroid: No thyromegaly.  Cardiovascular:     Rate and Rhythm: Normal rate and regular rhythm.  Pulmonary:     Effort: No respiratory distress.     Breath sounds: Normal breath sounds. No wheezing.  Abdominal:     General: Bowel sounds are normal.     Palpations: Abdomen is soft.     Tenderness: There is no abdominal tenderness.  Musculoskeletal:        General: No swelling or tenderness.     Cervical back: Neck supple. No tenderness.  Lymphadenopathy:     Cervical: No cervical adenopathy.  Skin:    Findings: No erythema or rash.  Neurological:     Mental Status: She is alert.  Psychiatric:        Mood and Affect: Mood normal.        Behavior: Behavior normal.     Outpatient Encounter Medications as of 01/28/2021  Medication Sig   amLODipine (NORVASC) 10 MG tablet Take 1 tablet (10 mg total) by mouth daily.   rosuvastatin (CRESTOR) 5 MG tablet Take 1 tablet (5 mg total) by mouth daily.   No facility-administered encounter medications on file as of 01/28/2021.     Lab Results  Component Value Date   WBC 5.5 11/27/2020   HGB 14.5 11/27/2020   HCT 44.9 11/27/2020   PLT 313.0 11/27/2020   GLUCOSE 87 11/27/2020   CHOL 234 (H) 11/27/2020   TRIG 169.0 (H) 11/27/2020   HDL 57.90 11/27/2020   LDLCALC 142 (H) 11/27/2020   ALT 11 11/27/2020   AST 13 11/27/2020   NA 141 11/27/2020   K 4.0 11/27/2020   CL 105 11/27/2020   CREATININE 0.62 11/27/2020   BUN 11 11/27/2020   CO2 27 11/27/2020   TSH 1.19 11/27/2020        Assessment & Plan:   Problem List Items Addressed This Visit   None    01/27/2021, MD

## 2021-02-02 ENCOUNTER — Encounter: Payer: Self-pay | Admitting: Internal Medicine

## 2021-02-02 NOTE — Assessment & Plan Note (Signed)
On amlodipine 10mg  q day.  Blood pressure as outlined.  Follow pressures. Follow metabolic panel.  Some lower extremity swelling.  States has been present for a long time.  Elevate legs.  Discussed compression hose.  Discussed making changes in medication - decreasing amlodipine and adding another blood pressure medication.  She wants to hold on changes.  Follow pressures.

## 2021-02-02 NOTE — Assessment & Plan Note (Signed)
On crestor.  Follow lipid panel and liver function tests.   

## 2021-02-02 NOTE — Assessment & Plan Note (Signed)
Has been receiving B12 injections.  No significant change.  She denies any issues.  Answering questions appropriately.  Follow.

## 2021-02-02 NOTE — Assessment & Plan Note (Signed)
Has been receiving B12 injections.  Recheck B12.

## 2021-02-02 NOTE — Assessment & Plan Note (Signed)
Recheck alkaline phos as well as GGT and liver panel.  Further w/up pending results.

## 2021-03-02 ENCOUNTER — Ambulatory Visit (INDEPENDENT_AMBULATORY_CARE_PROVIDER_SITE_OTHER): Payer: Medicare Other

## 2021-03-02 ENCOUNTER — Other Ambulatory Visit: Payer: Self-pay

## 2021-03-02 DIAGNOSIS — E538 Deficiency of other specified B group vitamins: Secondary | ICD-10-CM | POA: Diagnosis not present

## 2021-03-02 MED ORDER — CYANOCOBALAMIN 1000 MCG/ML IJ SOLN
1000.0000 ug | Freq: Once | INTRAMUSCULAR | Status: AC
  Administered 2021-03-02: 1000 ug via INTRAMUSCULAR

## 2021-03-02 NOTE — Progress Notes (Signed)
Patient presented for B 12 injection to right deltoid, patient voiced no concerns nor showed any signs of distress during injection. 

## 2021-03-17 ENCOUNTER — Other Ambulatory Visit: Payer: Self-pay | Admitting: Internal Medicine

## 2021-04-02 ENCOUNTER — Other Ambulatory Visit: Payer: Self-pay

## 2021-04-02 ENCOUNTER — Ambulatory Visit (INDEPENDENT_AMBULATORY_CARE_PROVIDER_SITE_OTHER): Payer: Medicare Other

## 2021-04-02 DIAGNOSIS — E538 Deficiency of other specified B group vitamins: Secondary | ICD-10-CM

## 2021-04-02 DIAGNOSIS — Z23 Encounter for immunization: Secondary | ICD-10-CM | POA: Diagnosis not present

## 2021-04-02 MED ORDER — CYANOCOBALAMIN 1000 MCG/ML IJ SOLN
1000.0000 ug | Freq: Once | INTRAMUSCULAR | Status: AC
  Administered 2021-04-02: 1000 ug via INTRAMUSCULAR

## 2021-04-02 NOTE — Progress Notes (Signed)
Patient presented for B 12 injection to left deltoid, patient voiced no concerns nor showed any signs of distress during injection. 

## 2021-04-30 ENCOUNTER — Other Ambulatory Visit: Payer: Self-pay

## 2021-04-30 ENCOUNTER — Other Ambulatory Visit (INDEPENDENT_AMBULATORY_CARE_PROVIDER_SITE_OTHER): Payer: Medicare Other

## 2021-04-30 DIAGNOSIS — E7800 Pure hypercholesterolemia, unspecified: Secondary | ICD-10-CM

## 2021-04-30 DIAGNOSIS — R748 Abnormal levels of other serum enzymes: Secondary | ICD-10-CM | POA: Diagnosis not present

## 2021-04-30 LAB — ALT: ALT: 12 U/L (ref 0–35)

## 2021-04-30 LAB — GAMMA GT: GGT: 12 U/L (ref 7–51)

## 2021-04-30 LAB — AST: AST: 13 U/L (ref 0–37)

## 2021-05-04 ENCOUNTER — Other Ambulatory Visit: Payer: Self-pay

## 2021-05-04 ENCOUNTER — Ambulatory Visit (INDEPENDENT_AMBULATORY_CARE_PROVIDER_SITE_OTHER): Payer: Medicare Other | Admitting: *Deleted

## 2021-05-04 DIAGNOSIS — E538 Deficiency of other specified B group vitamins: Secondary | ICD-10-CM | POA: Diagnosis not present

## 2021-05-04 LAB — ALKALINE PHOSPHATASE, ISOENZYMES
Alkaline Phosphatase: 162 IU/L — ABNORMAL HIGH (ref 44–121)
BONE FRACTION: 33 % (ref 14–68)
INTESTINAL FRAC.: 8 % (ref 0–18)
LIVER FRACTION: 59 % (ref 18–85)

## 2021-05-04 MED ORDER — CYANOCOBALAMIN 1000 MCG/ML IJ SOLN
1000.0000 ug | Freq: Once | INTRAMUSCULAR | Status: AC
  Administered 2021-05-04: 1000 ug via INTRAMUSCULAR

## 2021-05-04 NOTE — Progress Notes (Signed)
Patient presented for B 12 injection to right deltoid, patient voiced no concerns nor showed any signs of distress during injection. 

## 2021-05-28 ENCOUNTER — Ambulatory Visit (INDEPENDENT_AMBULATORY_CARE_PROVIDER_SITE_OTHER): Payer: Medicare Other

## 2021-05-28 VITALS — Ht 64.0 in | Wt 161.0 lb

## 2021-05-28 DIAGNOSIS — Z0000 Encounter for general adult medical examination without abnormal findings: Secondary | ICD-10-CM | POA: Diagnosis not present

## 2021-05-28 NOTE — Progress Notes (Signed)
Subjective:   Debra Barr is a 76 y.o. female who presents for Medicare Annual (Subsequent) preventive examination.  Review of Systems    No ROS.  Medicare Wellness Virtual Visit.  Visual/audio telehealth visit, UTA vital signs.   See social history for additional risk factors.   Cardiac Risk Factors include: advanced age (>5255men, 29>65 women);hypertension     Objective:    Today's Vitals   05/28/21 1032  Weight: 161 lb (73 kg)  Height: 5\' 4"  (1.626 m)   Body mass index is 27.64 kg/m.  Advanced Directives 05/28/2021 05/27/2020 05/25/2019  Does Patient Have a Medical Advance Directive? Yes No Yes  Type of Estate agentAdvance Directive Healthcare Power of [place];Living will - Living will  Does patient want to make changes to medical advance directive? No - Patient declined - No - Patient declined  Copy of Healthcare Power of Attorney in Chart? No - copy requested - -  Would patient like information on creating a medical advance directive? - No - Patient declined -    Current Medications (verified) Outpatient Encounter Medications as of 05/28/2021  Medication Sig   amLODipine (NORVASC) 10 MG tablet Take 1 tablet (10 mg total) by mouth daily.   rosuvastatin (CRESTOR) 5 MG tablet TAKE ONE TABLET BY MOUTH DAILY   No facility-administered encounter medications on file as of 05/28/2021.    Allergies (verified) Patient has no allergy information on record.   History: Past Medical History:  Diagnosis Date   Arthritis    Past Surgical History:  Procedure Laterality Date   NO PAST SURGERIES     Family History  Problem Relation Age of Onset   Arthritis Mother    Early death Father    Hyperlipidemia Father    Hypertension Brother    Kidney disease Brother    Social History   Socioeconomic History   Marital status: Married    Spouse name: Not on file   Number of children: Not on file   Years of education: Not on file   Highest education level: Not on file  Occupational History    Not on file  Tobacco Use   Smoking status: Never   Smokeless tobacco: Never  Vaping Use   Vaping Use: Never used  Substance and Sexual Activity   Alcohol use: Yes   Drug use: Never   Sexual activity: Not Currently  Other Topics Concern   Not on file  Social History Narrative   Not on file   Social Determinants of Health   Financial Resource Strain: Low Risk    Difficulty of Paying Living Expenses: Not hard at all  Food Insecurity: No Food Insecurity   Worried About Programme researcher, broadcasting/film/videounning Out of Food in the Last Year: Never true   Ran Out of Food in the Last Year: Never true  Transportation Needs: No Transportation Needs   Lack of Transportation (Medical): No   Lack of Transportation (Non-Medical): No  Physical Activity: Not on file  Stress: No Stress Concern Present   Feeling of Stress : Not at all  Social Connections: Unknown   Frequency of Communication with Friends and Family: More than three times a week   Frequency of Social Gatherings with Friends and Family: More than three times a week   Attends Religious Services: Not on file   Active Member of Clubs or Organizations: Not on file   Attends BankerClub or Organization Meetings: Not on file   Marital Status: Married    Tobacco Counseling Counseling given: Not  Answered   Clinical Intake:  Pre-visit preparation completed: Yes        Diabetes: No  How often do you need to have someone help you when you read instructions, pamphlets, or other written materials from your doctor or pharmacy?: 1 - Never    Interpreter Needed?: No      Activities of Daily Living In your present state of health, do you have any difficulty performing the following activities: 05/28/2021  Hearing? N  Vision? N  Difficulty concentrating or making decisions? (No Data)  Comment Hx memory loss. Uses calendar regularly.  Walking or climbing stairs? N  Dressing or bathing? N  Doing errands, shopping? N  Preparing Food and eating ? N  Using the  Toilet? N  In the past six months, have you accidently leaked urine? Y  Do you have problems with loss of bowel control? N  Managing your Medications? N  Managing your Finances? N  Housekeeping or managing your Housekeeping? N  Some recent data might be hidden    Patient Care Team: Dale Griswold, MD as PCP - General (Internal Medicine)  Indicate any recent Medical Services you may have received from other than [facility] providers in the past year (date may be approximate).     Assessment:   This is a routine wellness examination for Debra Barr.   Virtual Visit via Telephone Note  I connected with  Debra Barr on 05/28/21 at 10:30 AM EST by telephone and verified that I am speaking with the correct person using two identifiers.  Persons participating in the virtual visit: patient/Nurse Health Advisor   I discussed the limitations, risks, security and privacy concerns of performing an evaluation and management service by telephone and the availability of in person appointments. The patient expressed understanding and agreed to proceed.  Interactive audio and video telecommunications were attempted between this nurse and patient, however failed, due to patient having technical difficulties OR patient did not have access to video capability.  We continued and completed visit with audio only.  Some vital signs may be absent or patient reported.   Hearing/Vision screen Hearing Screening - Comments:: Patient is able to hear conversational tones without difficulty. No issues reported. Vision Screening - Comments:: Wears reader lenses     Dietary issues and exercise activities discussed: Current Exercise Habits: Home exercise routine, Type of exercise: walking, Intensity: Mild Healthy diet Good water intake   Goals Addressed             This Visit's Progress    Follow up with Primary Care Provider       As needed.       Depression Screen PHQ 2/9 Scores 05/28/2021 01/28/2021  11/27/2020 05/27/2020 05/25/2019 11/13/2018  PHQ - 2 Score 0 0 0 0 0 0    Fall Risk Fall Risk  05/28/2021 01/28/2021 11/27/2020 05/27/2020 05/25/2019  Falls in the past year? 0 0 0 0 0  Number falls in past yr: 0 0 0 0 -  Injury with Fall? - 0 0 0 -  Follow up Falls evaluation completed Falls evaluation completed Falls evaluation completed - Falls evaluation completed   FALL RISK PREVENTION PERTAINING TO THE HOME: Home free of loose throw rugs in walkways, pet beds, electrical cords, etc? Yes  Adequate lighting in your home to reduce risk of falls? Yes   ASSISTIVE DEVICES UTILIZED TO PREVENT FALLS: Life alert? No  Use of a cane, walker or w/c? No   TIMED UP AND GO: Was  the test performed? No .   Cognitive Function:  Patient is alert and oriented x3.  Enjoys reading, playing cards and other brain health stimulating activities.    6CIT Screen 05/28/2021 05/27/2020 05/25/2019  What Year? 0 points - 0 points  What month? 0 points - 0 points  What time? 0 points - 0 points  Count back from 20 0 points - 0 points  Months in reverse 0 points 0 points 0 points  Repeat phrase 0 points - 0 points  Total Score 0 - 0   Immunizations Immunization History  Administered Date(s) Administered   Fluad Quad(high Dose 65+) 04/02/2021   PFIZER(Purple Top)SARS-COV-2 Vaccination 06/18/2019, 07/09/2019   Pneumococcal Conjugate-13 12/07/2018   Tdap 09/06/2008   TDAP status: Due, Education has been provided regarding the importance of this vaccine. Advised may receive this vaccine at local pharmacy or Health Dept. Aware to provide a copy of the vaccination record if obtained from local pharmacy or Health Dept. Verbalized acceptance and understanding.Deferred.   Pneumococcal vaccine status: Due, Education has been provided regarding the importance of this vaccine. Advised may receive this vaccine at local pharmacy or Health Dept. Aware to provide a copy of the vaccination record if obtained from local pharmacy or  Health Dept. Verbalized acceptance and understanding.Plans to receive next physical on 06/01/20.  Appointment note edited.    Shingrix Completed?: No.    Education has been provided regarding the importance of this vaccine. Patient has been advised to call insurance company to determine out of pocket expense if they have not yet received this vaccine. Advised may also receive vaccine at local pharmacy or Health Dept. Verbalized acceptance and understanding.  Screening Tests Health Maintenance  Topic Date Due   Hepatitis C Screening  06/01/2021 (Originally 07/26/1963)   Zoster Vaccines- Shingrix (1 of 2) 08/25/2021 (Originally 07/26/1994)   COVID-19 Vaccine (3 - Booster for Pfizer series) 12/24/2021 (Originally 09/03/2019)   Pneumonia Vaccine 8365+ Years old (2 - PPSV23 if available, else PCV20) 05/28/2022 (Originally 12/07/2019)   TETANUS/TDAP  05/28/2022 (Originally 09/07/2018)   INFLUENZA VACCINE  Completed   HPV VACCINES  Aged Out   DEXA SCAN  Discontinued   Health Maintenance There are no preventive care reminders to display for this patient.  Mammogram- discontinued per chart  Lung Cancer Screening: (Low Dose CT Chest recommended if Age 42-80 years, 30 pack-year currently smoking OR have quit w/in 15years.) does not qualify.   Hepatitis C Screening: deferred.   Vision Screening: Recommended annual ophthalmology exams for early detection of glaucoma and other disorders of the eye.  Dental Screening: Recommended annual dental exams for proper oral hygiene  Community Resource Referral / Chronic Care Management: CRR required this visit?  No   CCM required this visit?  No      Plan:   Keep all routine maintenance appointments.   I have personally reviewed and noted the following in the patients chart:   Medical and social history Use of alcohol, tobacco or illicit drugs  Current medications and supplements including opioid prescriptions. Not taking opioid.  Functional ability and  status Nutritional status Physical activity Advanced directives List of other physicians Hospitalizations, surgeries, and ER visits in previous 12 months Vitals Screenings to include cognitive, depression, and falls Referrals and appointments  In addition, I have reviewed and discussed with patient certain preventive protocols, quality metrics, and best practice recommendations. A written personalized care plan for preventive services as well as general preventive health recommendations were provided to patient.  Ashok Pall, LPN   8/0/1655

## 2021-05-28 NOTE — Patient Instructions (Addendum)
Debra Barr , Thank you for taking time to come for your Medicare Wellness Visit. I appreciate your ongoing commitment to your health goals. Please review the following plan we discussed and let me know if I can assist you in the future.   These are the goals we discussed:  Goals      Follow up with Primary Care Provider     As needed.        This is a list of the screening recommended for you and due dates:  Health Maintenance  Topic Date Due   Hepatitis C Screening: USPSTF Recommendation to screen - Ages 18-79 yo.  06/01/2021*   Zoster (Shingles) Vaccine (1 of 2) 08/25/2021*   COVID-19 Vaccine (3 - Booster for Pfizer series) 12/24/2021*   Pneumonia Vaccine (2 - PPSV23 if available, else PCV20) 05/28/2022*   Tetanus Vaccine  05/28/2022*   Flu Shot  Completed   HPV Vaccine  Aged Out   DEXA scan (bone density measurement)  Discontinued  *Topic was postponed. The date shown is not the original due date.    Advanced directives: End of life planning; Advance aging; Advanced directives discussed.  Copy of current HCPOA/Living Will requested.    Conditions/risks identified: none new  Follow up in one year for your annual wellness visit    Preventive Care 65 Years and Older, Female Preventive care refers to lifestyle choices and visits with your health care provider that can promote health and wellness. What does preventive care include? A yearly physical exam. This is also called an annual well check. Dental exams once or twice a year. Routine eye exams. Ask your health care provider how often you should have your eyes checked. Personal lifestyle choices, including: Daily care of your teeth and gums. Regular physical activity. Eating a healthy diet. Avoiding tobacco and drug use. Limiting alcohol use. Practicing safe sex. Taking low-dose aspirin every day. Taking vitamin and mineral supplements as recommended by your health care provider. What happens during an annual well  check? The services and screenings done by your health care provider during your annual well check will depend on your age, overall health, lifestyle risk factors, and family history of disease. Counseling  Your health care provider may ask you questions about your: Alcohol use. Tobacco use. Drug use. Emotional well-being. Home and relationship well-being. Sexual activity. Eating habits. History of falls. Memory and ability to understand (cognition). Work and work Statistician. Reproductive health. Screening  You may have the following tests or measurements: Height, weight, and BMI. Blood pressure. Lipid and cholesterol levels. These may be checked every 5 years, or more frequently if you are over 69 years old. Skin check. Lung cancer screening. You may have this screening every year starting at age 87 if you have a 30-pack-year history of smoking and currently smoke or have quit within the past 15 years. Fecal occult blood test (FOBT) of the stool. You may have this test every year starting at age 75. Flexible sigmoidoscopy or colonoscopy. You may have a sigmoidoscopy every 5 years or a colonoscopy every 10 years starting at age 37. Hepatitis C blood test. Hepatitis B blood test. Sexually transmitted disease (STD) testing. Diabetes screening. This is done by checking your blood sugar (glucose) after you have not eaten for a while (fasting). You may have this done every 1-3 years. Bone density scan. This is done to screen for osteoporosis. You may have this done starting at age 61. Mammogram. This may be done every 1-2  years. Talk to your health care provider about how often you should have regular mammograms. Talk with your health care provider about your test results, treatment options, and if necessary, the need for more tests. Vaccines  Your health care provider may recommend certain vaccines, such as: Influenza vaccine. This is recommended every year. Tetanus, diphtheria, and  acellular pertussis (Tdap, Td) vaccine. You may need a Td booster every 10 years. Zoster vaccine. You may need this after age 74. Pneumococcal 13-valent conjugate (PCV13) vaccine. One dose is recommended after age 65. Pneumococcal polysaccharide (PPSV23) vaccine. One dose is recommended after age 20. Talk to your health care provider about which screenings and vaccines you need and how often you need them. This information is not intended to replace advice given to you by your health care provider. Make sure you discuss any questions you have with your health care provider. Document Released: 05/09/2015 Document Revised: 12/31/2015 Document Reviewed: 02/11/2015 Elsevier Interactive Patient Education  2017 [place] Prevention in the Home Falls can cause injuries. They can happen to people of all ages. There are many things you can do to make your home safe and to help prevent falls. What can I do on the outside of my home? Regularly fix the edges of walkways and driveways and fix any cracks. Remove anything that might make you trip as you walk through a door, such as a raised step or threshold. Trim any bushes or trees on the path to your home. Use bright outdoor lighting. Clear any walking paths of anything that might make someone trip, such as rocks or tools. Regularly check to see if handrails are loose or broken. Make sure that both sides of any steps have handrails. Any raised decks and porches should have guardrails on the edges. Have any leaves, snow, or ice cleared regularly. Use sand or salt on walking paths during winter. Clean up any spills in your garage right away. This includes oil or grease spills. What can I do in the bathroom? Use night lights. Install grab bars by the toilet and in the tub and shower. Do not use towel bars as grab bars. Use non-skid mats or decals in the tub or shower. If you need to sit down in the shower, use a plastic, non-slip stool. Keep  the floor dry. Clean up any water that spills on the floor as soon as it happens. Remove soap buildup in the tub or shower regularly. Attach bath mats securely with double-sided non-slip rug tape. Do not have throw rugs and other things on the floor that can make you trip. What can I do in the bedroom? Use night lights. Make sure that you have a light by your bed that is easy to reach. Do not use any sheets or blankets that are too big for your bed. They should not hang down onto the floor. Have a firm chair that has side arms. You can use this for support while you get dressed. Do not have throw rugs and other things on the floor that can make you trip. What can I do in the kitchen? Clean up any spills right away. Avoid walking on wet floors. Keep items that you use a lot in easy-to-reach places. If you need to reach something above you, use a strong step stool that has a grab bar. Keep electrical cords out of the way. Do not use floor polish or wax that makes floors slippery. If you must use wax, use non-skid floor  wax. Do not have throw rugs and other things on the floor that can make you trip. What can I do with my stairs? Do not leave any items on the stairs. Make sure that there are handrails on both sides of the stairs and use them. Fix handrails that are broken or loose. Make sure that handrails are as long as the stairways. Check any carpeting to make sure that it is firmly attached to the stairs. Fix any carpet that is loose or worn. Avoid having throw rugs at the top or bottom of the stairs. If you do have throw rugs, attach them to the floor with carpet tape. Make sure that you have a light switch at the top of the stairs and the bottom of the stairs. If you do not have them, ask someone to add them for you. What else can I do to help prevent falls? Wear shoes that: Do not have high heels. Have rubber bottoms. Are comfortable and fit you well. Are closed at the toe. Do not  wear sandals. If you use a stepladder: Make sure that it is fully opened. Do not climb a closed stepladder. Make sure that both sides of the stepladder are locked into place. Ask someone to hold it for you, if possible. Clearly mark and make sure that you can see: Any grab bars or handrails. First and last steps. Where the edge of each step is. Use tools that help you move around (mobility aids) if they are needed. These include: Canes. Walkers. Scooters. Crutches. Turn on the lights when you go into a dark area. Replace any light bulbs as soon as they burn out. Set up your furniture so you have a clear path. Avoid moving your furniture around. If any of your floors are uneven, fix them. If there are any pets around you, be aware of where they are. Review your medicines with your doctor. Some medicines can make you feel dizzy. This can increase your chance of falling. Ask your doctor what other things that you can do to help prevent falls. This information is not intended to replace advice given to you by your health care provider. Make sure you discuss any questions you have with your health care provider. Document Released: 02/06/2009 Document Revised: 09/18/2015 Document Reviewed: 05/17/2014 Elsevier Interactive Patient Education  2017 [employer].

## 2021-05-30 ENCOUNTER — Telehealth: Payer: Self-pay | Admitting: Internal Medicine

## 2021-05-30 NOTE — Telephone Encounter (Signed)
-----   Message from Cammie Sickle, MD sent at 05/07/2021  2:23 PM EST ----- Regarding: RE: question Hi-given the slow progressive rise of alkaline phosphatase over the last 2 years my suspicions for malignancy is small [hopefully she is up-to-date on her age-appropriate cancer screening].  1 thought-I might have is Paget's disease/for which a bone scan could be done.   Also I think, if above is negative - then this does warrant further evaluation with GI/imaging as per GI.  I would be happy to see her above work-up is negative.  GB   ----- Message ----- From: Einar Pheasant, MD Sent: 05/07/2021   6:04 AM EST To: Cammie Sickle, MD Subject: question                                       Ms Hara has had a persistent elevated alkaline phosphatase level.  (Mostly 150s with recent check 160s).  The remainder of her liver panel and GGT - wnl.  Recent isoenzymes:  liver fraction percent 59, bone 33 and intestine 8%. (All wnl).  With these labs and GGT being normal, I was not sure what further testing you would recommend to confirm no other bony ( or non hepatic) etiology.  Just wanted to get your thoughts.  I can schedule her an appt if needed. Thank you for your help.    Hope you are doing well [employer]

## 2021-06-01 ENCOUNTER — Ambulatory Visit (INDEPENDENT_AMBULATORY_CARE_PROVIDER_SITE_OTHER): Payer: Medicare Other | Admitting: Internal Medicine

## 2021-06-01 ENCOUNTER — Other Ambulatory Visit: Payer: Self-pay

## 2021-06-01 VITALS — BP 126/72 | HR 94 | Temp 98.6°F | Resp 16 | Ht 64.5 in | Wt 164.0 lb

## 2021-06-01 DIAGNOSIS — I10 Essential (primary) hypertension: Secondary | ICD-10-CM

## 2021-06-01 DIAGNOSIS — E538 Deficiency of other specified B group vitamins: Secondary | ICD-10-CM | POA: Diagnosis not present

## 2021-06-01 DIAGNOSIS — R748 Abnormal levels of other serum enzymes: Secondary | ICD-10-CM

## 2021-06-01 DIAGNOSIS — Z23 Encounter for immunization: Secondary | ICD-10-CM

## 2021-06-01 DIAGNOSIS — Z0000 Encounter for general adult medical examination without abnormal findings: Secondary | ICD-10-CM | POA: Diagnosis not present

## 2021-06-01 DIAGNOSIS — E7800 Pure hypercholesterolemia, unspecified: Secondary | ICD-10-CM

## 2021-06-01 DIAGNOSIS — R413 Other amnesia: Secondary | ICD-10-CM

## 2021-06-01 NOTE — Progress Notes (Addendum)
Patient ID: Debra Barr, female   DOB: 1946-01-26, 76 y.o.   MRN: [id number]   Subjective:    Patient ID: Debra Barr, female    DOB: 1945/12/21, 76 y.o.   MRN: [id number]  This visit occurred during the SARS-CoV-2 public health emergency.  Safety protocols were in place, including screening questions prior to the visit, additional usage of staff PPE, and extensive cleaning of exam room while observing appropriate contact time as indicated for disinfecting solutions.   Patient here for her physical exam.   Chief Complaint  Patient presents with   Annual Exam   .   HPI Trying to stay active.  No chest pain or sob reported. Eating.  No nausea or vomiting.  No cough or congestion.  No abdominal pain.  Bowels moving.  Discussed mammogram.  She declines.  No increased lower extremity swelling.  Reports is stable.  States blood pressure doing well.    Past Medical History:  Diagnosis Date   Arthritis    Past Surgical History:  Procedure Laterality Date   NO PAST SURGERIES     Family History  Problem Relation Age of Onset   Arthritis Mother    Early death Father    Hyperlipidemia Father    Hypertension Brother    Kidney disease Brother    Social History   Socioeconomic History   Marital status: Married    Spouse name: Not on file   Number of children: Not on file   Years of education: Not on file   Highest education level: Not on file  Occupational History   Not on file  Tobacco Use   Smoking status: Never   Smokeless tobacco: Never  Vaping Use   Vaping Use: Never used  Substance and Sexual Activity   Alcohol use: Yes   Drug use: Never   Sexual activity: Not Currently  Other Topics Concern   Not on file  Social History Narrative   Not on file   Social Determinants of Health   Financial Resource Strain: Low Risk    Difficulty of Paying Living Expenses: Not hard at all  Food Insecurity: No Food Insecurity   Worried About Charity fundraiser in the Last  Year: Never true   [place] in the Last Year: Never true  Transportation Needs: No Transportation Needs   Lack of Transportation (Medical): No   Lack of Transportation (Non-Medical): No  Physical Activity: Not on file  Stress: No Stress Concern Present   Feeling of Stress : Not at all  Social Connections: Unknown   Frequency of Communication with Friends and Family: More than three times a week   Frequency of Social Gatherings with Friends and Family: More than three times a week   Attends Religious Services: Not on Electrical engineer or Organizations: Not on file   Attends Archivist Meetings: Not on file   Marital Status: Married     Review of Systems  Constitutional:  Negative for appetite change and unexpected weight change.  HENT:  Negative for congestion and sinus pressure.   Respiratory:  Negative for cough, chest tightness and shortness of breath.   Cardiovascular:  Negative for chest pain and palpitations.       Leg swelling - no increased swelling.  Stable.   Gastrointestinal:  Negative for abdominal pain, diarrhea, nausea and vomiting.  Genitourinary:  Negative for difficulty urinating and dysuria.  Musculoskeletal:  Negative for joint swelling  and myalgias.  Skin:  Negative for color change and rash.  Neurological:  Negative for dizziness, light-headedness and headaches.  Psychiatric/Behavioral:  Negative for agitation and dysphoric mood.       Objective:     BP 126/72    Pulse 94    Temp 98.6 F (37 C)    Resp 16    Ht 5' 4.5" (1.638 m)    Wt 164 lb (74.4 kg)    SpO2 98%    BMI 27.72 kg/m  Wt Readings from Last 3 Encounters:  06/01/21 164 lb (74.4 kg)  05/28/21 161 lb (73 kg)  01/28/21 161 lb (73 kg)    Physical Exam Vitals reviewed.  Constitutional:      General: She is not in acute distress.    Appearance: Normal appearance. She is well-developed.  HENT:     Head: Normocephalic and atraumatic.     Right Ear: External ear  normal.     Left Ear: External ear normal.  Eyes:     General: No scleral icterus.       Right eye: No discharge.        Left eye: No discharge.     Conjunctiva/sclera: Conjunctivae normal.  Neck:     Thyroid: No thyromegaly.  Cardiovascular:     Rate and Rhythm: Normal rate and regular rhythm.  Pulmonary:     Effort: No tachypnea, accessory muscle usage or respiratory distress.     Breath sounds: Normal breath sounds. No decreased breath sounds or wheezing.  Chest:  Breasts:    Right: No inverted nipple, mass, nipple discharge or tenderness (no axillary adenopathy).     Left: No inverted nipple, mass, nipple discharge or tenderness (no axilarry adenopathy).  Abdominal:     General: Bowel sounds are normal.     Palpations: Abdomen is soft.     Tenderness: There is no abdominal tenderness.  Musculoskeletal:        General: No swelling or tenderness.     Cervical back: Neck supple. No tenderness.  Lymphadenopathy:     Cervical: No cervical adenopathy.  Skin:    Findings: No erythema or rash.  Neurological:     Mental Status: She is alert and oriented to person, place, and time.  Psychiatric:        Mood and Affect: Mood normal.        Behavior: Behavior normal.     Outpatient Encounter Medications as of 06/01/2021  Medication Sig   amLODipine (NORVASC) 10 MG tablet Take 1 tablet (10 mg total) by mouth daily.   rosuvastatin (CRESTOR) 5 MG tablet TAKE ONE TABLET BY MOUTH DAILY   No facility-administered encounter medications on file as of 06/01/2021.     Lab Results  Component Value Date   WBC 5.5 11/27/2020   HGB 14.5 11/27/2020   HCT 44.9 11/27/2020   PLT 313.0 11/27/2020   GLUCOSE 82 06/01/2021   CHOL 171 06/01/2021   TRIG 83.0 06/01/2021   HDL 65.00 06/01/2021   LDLCALC 89 06/01/2021   ALT 13 06/01/2021   AST 14 06/01/2021   NA 143 06/01/2021   K 4.0 06/01/2021   CL 102 06/01/2021   CREATININE 0.68 06/01/2021   BUN 13 06/01/2021   CO2 35 (H) 06/01/2021    TSH 1.19 11/27/2020       Assessment & Plan:   Problem List Items Addressed This Visit     B12 deficiency    Continue b12 injections.  Elevated alkaline phosphatase level    Recheck alkaline phos today.  Has had a persistent elevated alkaline phosphatase level.  (Mostly 150s with recent check 160s).  The remainder of her liver panel and GGT - wnl.  Recent isoenzymes:  liver fraction percent 59, bone 33 and intestine 8%. (All wnl).  With these labs and GGT being normal, check intact PTH.  Consider bone scan.        Relevant Orders   Hepatic function panel (Completed)   PTH, intact (no Ca) (Completed)   Healthcare maintenance    Physical today 06/01/21.  Declines mammogram and colonoscopy.  Discuss bone density.      Hypercholesterolemia    On crestor.  Follow lipid panel and liver function tests.        Hypertension, essential    On amlodipine 10mg  q day.  Blood pressure as outlined.  Follow pressures. Follow metabolic panel.  Some lower extremity swelling.  States has been present for a long time.  Elevate legs.  Discussed compression hose.  No increased swelling.        Relevant Orders   Lipid panel (Completed)   Basic metabolic panel (Completed)   Memory change    Has been receiving B12 injections.  No significant change.  She denies any issues.  Answering questions appropriately.  Follow.       Other Visit Diagnoses     Routine general medical examination at a health care facility    -  Primary   Need for 23-polyvalent pneumococcal polysaccharide vaccine       Relevant Orders   Pneumococcal polysaccharide vaccine 23-valent greater than or equal to 2yo subcutaneous/IM (Completed)        Einar Pheasant, MD

## 2021-06-02 LAB — BASIC METABOLIC PANEL
BUN: 13 mg/dL (ref 6–23)
CO2: 35 mEq/L — ABNORMAL HIGH (ref 19–32)
Calcium: 9.7 mg/dL (ref 8.4–10.5)
Chloride: 102 mEq/L (ref 96–112)
Creatinine, Ser: 0.68 mg/dL (ref 0.40–1.20)
GFR: 84.34 mL/min (ref >60.00)
Glucose, Bld: 82 mg/dL (ref 70–99)
Potassium: 4 mEq/L (ref 3.5–5.1)
Sodium: 143 mEq/L (ref 135–145)

## 2021-06-02 LAB — HEPATIC FUNCTION PANEL
ALT: 13 U/L (ref 0–35)
AST: 14 U/L (ref 0–37)
Albumin: 4.3 g/dL (ref 3.5–5.2)
Alkaline Phosphatase: 141 U/L — ABNORMAL HIGH (ref 39–117)
Bilirubin, Direct: 0.1 mg/dL (ref 0.0–0.3)
Total Bilirubin: 0.5 mg/dL (ref 0.2–1.2)
Total Protein: 7.5 g/dL (ref 6.0–8.3)

## 2021-06-02 LAB — LIPID PANEL
Cholesterol: 171 mg/dL (ref 0–200)
HDL: 65 mg/dL (ref >39.00)
LDL Cholesterol: 89 mg/dL (ref 0–99)
NonHDL: 105.87
Total CHOL/HDL Ratio: 3
Triglycerides: 83 mg/dL (ref 0.0–149.0)
VLDL: 16.6 mg/dL (ref 0.0–40.0)

## 2021-06-02 LAB — PARATHYROID HORMONE, INTACT (NO CA): PTH: 57 pg/mL (ref 16–77)

## 2021-06-04 ENCOUNTER — Ambulatory Visit (INDEPENDENT_AMBULATORY_CARE_PROVIDER_SITE_OTHER): Payer: Medicare Other

## 2021-06-04 ENCOUNTER — Other Ambulatory Visit: Payer: Self-pay

## 2021-06-04 DIAGNOSIS — E538 Deficiency of other specified B group vitamins: Secondary | ICD-10-CM | POA: Diagnosis not present

## 2021-06-04 MED ORDER — CYANOCOBALAMIN 1000 MCG/ML IJ SOLN
1000.0000 ug | Freq: Once | INTRAMUSCULAR | Status: AC
  Administered 2021-06-04: 1000 ug via INTRAMUSCULAR

## 2021-06-04 NOTE — Progress Notes (Signed)
Debra Barr presents today for injection per MD orders. B12 injection administered IM in right Upper Arm. Administration without incident. Patient tolerated well.  Mike Berntsen,cma

## 2021-06-07 ENCOUNTER — Encounter: Payer: Self-pay | Admitting: Internal Medicine

## 2021-06-07 DIAGNOSIS — Z0000 Encounter for general adult medical examination without abnormal findings: Secondary | ICD-10-CM | POA: Insufficient documentation

## 2021-06-07 NOTE — Assessment & Plan Note (Signed)
On crestor.  Follow lipid panel and liver function tests.   

## 2021-06-07 NOTE — Assessment & Plan Note (Signed)
On amlodipine 10mg  q day.  Blood pressure as outlined.  Follow pressures. Follow metabolic panel.  Some lower extremity swelling.  States has been present for a long time.  Elevate legs.  Discussed compression hose.  No increased swelling.

## 2021-06-07 NOTE — Assessment & Plan Note (Signed)
Continue b12 injections.  

## 2021-06-07 NOTE — Assessment & Plan Note (Addendum)
Recheck alkaline phos today.  Has had a persistent elevated alkaline phosphatase level.  (Mostly 150s with recent check 160s).  The remainder of her liver panel and GGT - wnl.  Recent isoenzymes:  liver fraction percent 59, bone 33 and intestine 8%. (All wnl).  With these labs and GGT being normal, check intact PTH.  Consider bone scan.

## 2021-06-07 NOTE — Assessment & Plan Note (Signed)
Physical today 06/01/21.  Declines mammogram and colonoscopy.  Discuss bone density.

## 2021-06-07 NOTE — Assessment & Plan Note (Signed)
Has been receiving B12 injections.  No significant change.  She denies any issues.  Answering questions appropriately.  Follow.  

## 2021-06-08 ENCOUNTER — Telehealth: Payer: Self-pay

## 2021-06-08 NOTE — Telephone Encounter (Signed)
LMTCB regarding results.  

## 2021-06-08 NOTE — Telephone Encounter (Signed)
-----   Message from Dale , MD sent at 06/07/2021 11:19 AM EST ----- Notify Debra Barr that her cholesterol has improved significantly.  Alkaline phos remains elevated.  Remainder of liver panel wnl.  Kidney function wnl. PTH hormone wnl.  Given persistent elevated alkaline phos, I would like to evaluate further.  If agreeable, would like to schedule abdominal ultrasound and bone scan - to see if can determine etiology of elevation.

## 2021-06-10 ENCOUNTER — Other Ambulatory Visit: Payer: Self-pay | Admitting: Internal Medicine

## 2021-06-10 DIAGNOSIS — R748 Abnormal levels of other serum enzymes: Secondary | ICD-10-CM

## 2021-06-10 NOTE — Progress Notes (Signed)
Orders placed for abdominal ultrasound and bone scan

## 2021-06-11 ENCOUNTER — Telehealth: Payer: Self-pay | Admitting: Internal Medicine

## 2021-06-11 NOTE — Telephone Encounter (Signed)
Lft pt vm to call ofc to sch NM scan and [place]. thanks

## 2021-06-12 ENCOUNTER — Telehealth: Payer: Self-pay | Admitting: Internal Medicine

## 2021-06-12 NOTE — Telephone Encounter (Signed)
Lft pt vm to call ofc to sch NM scan and [place]. Thanks

## 2021-06-17 ENCOUNTER — Telehealth: Payer: Self-pay | Admitting: Internal Medicine

## 2021-06-17 NOTE — Telephone Encounter (Signed)
Lft pt vm on 02/16,02/17 and 02/22 no return call to sch scans.

## 2021-06-17 NOTE — Telephone Encounter (Signed)
Lft pt vm to call ofc to sch NM bone scan and [place]. thanks

## 2021-07-02 ENCOUNTER — Ambulatory Visit (INDEPENDENT_AMBULATORY_CARE_PROVIDER_SITE_OTHER): Payer: Medicare Other

## 2021-07-02 ENCOUNTER — Other Ambulatory Visit: Payer: Self-pay

## 2021-07-02 DIAGNOSIS — E538 Deficiency of other specified B group vitamins: Secondary | ICD-10-CM | POA: Diagnosis not present

## 2021-07-02 MED ORDER — CYANOCOBALAMIN 1000 MCG/ML IJ SOLN
1000.0000 ug | Freq: Once | INTRAMUSCULAR | Status: AC
  Administered 2021-07-02: 16:00:00 1000 ug via INTRAMUSCULAR

## 2021-07-02 NOTE — Progress Notes (Signed)
Patient presented for B 12 injection to left deltoid, patient voiced no concerns nor showed any signs of distress during injection. 

## 2021-08-03 ENCOUNTER — Ambulatory Visit (INDEPENDENT_AMBULATORY_CARE_PROVIDER_SITE_OTHER): Payer: Medicare Other

## 2021-08-03 DIAGNOSIS — E538 Deficiency of other specified B group vitamins: Secondary | ICD-10-CM

## 2021-08-03 MED ORDER — CYANOCOBALAMIN 1000 MCG/ML IJ SOLN
1000.0000 ug | Freq: Once | INTRAMUSCULAR | Status: AC
  Administered 2021-08-03: 1000 ug via INTRAMUSCULAR

## 2021-08-03 NOTE — Progress Notes (Signed)
Pt arrived for B12 injection, given in R deltoid. Pt tolerated injection well, showed no signs of distress nor voiced any concerns.  

## 2021-09-02 ENCOUNTER — Ambulatory Visit (INDEPENDENT_AMBULATORY_CARE_PROVIDER_SITE_OTHER): Payer: Medicare Other

## 2021-09-02 DIAGNOSIS — E538 Deficiency of other specified B group vitamins: Secondary | ICD-10-CM | POA: Diagnosis not present

## 2021-09-02 MED ORDER — CYANOCOBALAMIN 1000 MCG/ML IJ SOLN: 1000.0000 ug | Freq: Once | INTRAMUSCULAR | Status: DC

## 2021-09-02 MED ORDER — CYANOCOBALAMIN 1000 MCG/ML IJ SOLN
1000.0000 ug | Freq: Once | INTRAMUSCULAR | Status: AC
  Administered 2021-09-02: 1000 ug via INTRAMUSCULAR

## 2021-09-02 NOTE — Progress Notes (Signed)
Patient presented for B 12 injection to right deltoid, patient voiced no concerns nor showed any signs of distress during injection. 

## 2021-09-28 ENCOUNTER — Other Ambulatory Visit: Payer: Self-pay | Admitting: Internal Medicine

## 2021-09-29 ENCOUNTER — Ambulatory Visit: Payer: Medicare Other | Admitting: Internal Medicine

## 2021-10-25 ENCOUNTER — Other Ambulatory Visit: Payer: Self-pay | Admitting: Internal Medicine

## 2021-12-23 ENCOUNTER — Other Ambulatory Visit: Payer: Self-pay | Admitting: Internal Medicine

## 2021-12-29 DIAGNOSIS — H35373 Puckering of macula, bilateral: Secondary | ICD-10-CM | POA: Diagnosis not present

## 2021-12-29 DIAGNOSIS — H25013 Cortical age-related cataract, bilateral: Secondary | ICD-10-CM | POA: Diagnosis not present

## 2021-12-29 DIAGNOSIS — H524 Presbyopia: Secondary | ICD-10-CM | POA: Diagnosis not present

## 2021-12-29 DIAGNOSIS — H2513 Age-related nuclear cataract, bilateral: Secondary | ICD-10-CM | POA: Diagnosis not present

## 2022-02-03 DIAGNOSIS — H25043 Posterior subcapsular polar age-related cataract, bilateral: Secondary | ICD-10-CM | POA: Diagnosis not present

## 2022-02-03 DIAGNOSIS — H2513 Age-related nuclear cataract, bilateral: Secondary | ICD-10-CM | POA: Diagnosis not present

## 2022-02-03 DIAGNOSIS — H25013 Cortical age-related cataract, bilateral: Secondary | ICD-10-CM | POA: Diagnosis not present

## 2022-02-03 DIAGNOSIS — H35373 Puckering of macula, bilateral: Secondary | ICD-10-CM | POA: Diagnosis not present

## 2022-02-11 NOTE — Progress Notes (Signed)
[facility] Note  02/15/2022     CHIEF COMPLAINT Patient presents for Retina Evaluation   HISTORY OF PRESENT ILLNESS: Debra Barr is a 76 y.o. female who presents to the clinic today for:   HPI     Retina Evaluation   In both eyes.  This started 2 weeks ago.  Duration of 2 weeks.  I, the attending physician,  performed the HPI with the patient and updated documentation appropriately.        Comments   Pt here for ret eval per Dr. Lucita Ferrara for retinal clearance for cataract sx/ERM OU. Pt states [facility] not [facility] much recently. Pt has hx of HTN going back about 5 years. Pt wears OTC readers.       Last edited by Bernarda Caffey, MD on 02/15/2022  4:26 PM.    Pt here on the referral of Dr. Lucita Ferrara for concern of ERM OU / cataract clearance, pt has both eyes scheduled -- one for 11.28.23 and 12.04.23, pt was referred to Dr. Lucita Ferrara by Dr. Rick Duff,   Referring physician: Vevelyn Royals, MD [address] [place],  [place] [address]  HISTORICAL INFORMATION:   Selected notes from the [facility] Referred by Dr. Lucita Ferrara for ERM OU / cataract clearance OU LEE:  Ocular Hx- PMH-    CURRENT MEDICATIONS: No current outpatient medications on file. (Ophthalmic Drugs)   No current facility-administered medications for this visit. (Ophthalmic Drugs)   Current Outpatient Medications (Other)  Medication Sig   amLODipine (NORVASC) 10 MG tablet TAKE 1 TABLET BY MOUTH DAILY   rosuvastatin (CRESTOR) 5 MG tablet TAKE ONE TABLET BY MOUTH DAILY   No current facility-administered medications for this visit. (Other)   REVIEW OF SYSTEMS: ROS   Positive for: Cardiovascular, Eyes Negative for: Constitutional, Gastrointestinal, Neurological, Skin, Genitourinary, Musculoskeletal, HENT, Endocrine, Respiratory, Psychiatric, Allergic/Imm, Heme/Lymph Last edited by Kingsley Spittle, COT on 02/15/2022  1:09 PM.      ALLERGIES No Known Allergies  PAST MEDICAL HISTORY Past Medical History:  Diagnosis Date   Arthritis    Past Surgical History:  Procedure Laterality Date   NO PAST SURGERIES     FAMILY HISTORY Family History  Problem Relation Age of Onset   Arthritis Mother    Early death Father    Hyperlipidemia Father    Hypertension Brother    Kidney disease Brother    SOCIAL HISTORY Social History   Tobacco Use   Smoking status: Never   Smokeless tobacco: Never  Vaping Use   Vaping Use: Never used  Substance Use Topics   Alcohol use: Yes   Drug use: Never       OPHTHALMIC EXAM:  Base Eye Exam     Visual Acuity (Snellen - Linear)       Right Left   Dist [place] 20/70 20/200   Dist ph [place] 20/50 -2 20/50 -2         Tonometry (Tonopen, 1:30 PM)       Right Left   Pressure 12 11         Pupils       Dark Light Shape React APD   Right 2 1 Round Minimal None   Left 2 1 Round Minimal None         Visual Fields (Counting fingers)       Left Right    Full Full         Extraocular Movement  Right Left    Full, Ortho Full, Ortho         Neuro/Psych     Oriented x3: Yes   Mood/Affect: Normal         Dilation     Both eyes: 1.0% Mydriacyl, 2.5% Phenylephrine @ 1:31 PM           Slit Lamp and Fundus Exam     Slit Lamp Exam       Right Left   Lids/Lashes Dermatochalasis - upper lid Dermatochalasis - upper lid   Conjunctiva/Sclera White and quiet White and quiet   Cornea Mild tear film debris tear film debris   Anterior Chamber deep, clear, narrow temporal angle deep, clear, narrow temporal angle   Iris Round and dilated Round and dilated   Lens 2-3+ Nuclear sclerosis, 2-3+ Cortical cataract 2-3+ Nuclear sclerosis, 2-3+ Cortical cataract   Anterior Vitreous mild syneresis mild syneresis         Fundus Exam       Right Left   Disc Pink and Sharp Pink and Sharp   C/D Ratio 0.6 0.6   Macula Blunted foveal reflex, mild ERM with  early striae, no heme Blunted foveal reflex, mild ERM with early striae, no heme   Vessels mild attenuation, mild tortuosity mild attenuation, mild tortuosity   Periphery Attached, no heme Attached, No heme           Refraction     Manifest Refraction       Sphere Cylinder Axis Dist [facility]   Right +1.75 +3.75 175 20/50+2   Left +2.50 +1.00 160 20/50-2           IMAGING AND PROCEDURES  Imaging and Procedures for 02/15/2022  OCT, Retina - OU - Both Eyes       Right Eye Quality was good. Central Foveal Thickness: 412. Progression has no prior data. Findings include no IRF, no SRF, abnormal foveal contour, epiretinal membrane, macular pucker (ERM with blunted foveal contour, central thickening and early pucker).   Left Eye Quality was good. Central Foveal Thickness: 413. Progression has no prior data. Findings include no IRF, no SRF, abnormal foveal contour, epiretinal membrane, macular pucker (ERM with blunted foveal contour, central thickening and early pucker).   Notes *Images captured and stored on drive  Diagnosis / Impression:  ERM with blunted foveal contour, central thickening and early pucker OU  Clinical management:  See below  Abbreviations: NFP - Normal foveal profile. CME - cystoid macular edema. PED - pigment epithelial detachment. IRF - intraretinal fluid. SRF - subretinal fluid. EZ - ellipsoid zone. ERM - epiretinal membrane. ORA - outer retinal atrophy. ORT - outer retinal tubulation. SRHM - subretinal hyper-reflective material. IRHM - intraretinal hyper-reflective material            ASSESSMENT/PLAN:    ICD-10-CM   1. 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 Essential hypertension  I10     3. Hypertensive retinopathy of both eyes  H35.033     4. Combined forms of age-related cataract of both eyes  H25.813      Epiretinal membrane, both eyes  - The natural history, anatomy, potential for loss of vision,  and treatment options including vitrectomy techniques and the complications of endophthalmitis, retinal detachment, vitreous hemorrhage, cataract progression and permanent vision loss discussed with the patient. - mild ERM OU w/ blunting of foveal contour, central retinal thickening and mild pucker OU -  BCVA 20/50 OU - no metamorphopsia, but generalized blur (mostly cataract?) - no indication for surgery at this time - monitor for now - clear from a retina standpoint to proceed with cataract surgery when pt and surgeon are ready  - f/u 4 mos, sooner prn -- DFE/OCT  2,3. Hypertensive retinopathy OU - discussed importance of tight BP control - monitor  4. Mixed Cataract OU - The symptoms of cataract, surgical options, and treatments and risks were discussed with patient. - discussed diagnosis and progression - under the expert management of Dr. Lucita Ferrara - clear from a retina standpoint to proceed with cataract surgery when pt and surgeon are ready  - scheduled for 11.28.23 and 12.04.23  Ophthalmic Meds Ordered this visit:  No orders of the defined types were placed in this encounter.    Return in about 4 months (around 06/18/2022) for f/u ERM OU, DFE, OCT.  There are no Patient Instructions on file for this visit.   Explained the diagnoses, plan, and follow up with the patient and they expressed understanding.  Patient expressed understanding of the importance of proper follow up care.   This document serves as a record of services personally performed by Gardiner Sleeper, MD, PhD. It was created on their behalf by Roselee Nova, COMT. The creation of this record is the provider's dictation and/or activities during the visit.  Electronically signed by: Roselee Nova, COMT 02/15/22 4:27 PM  This document serves as a record of services personally performed by Gardiner Sleeper, MD, PhD. It was created on their behalf by San Jetty. Owens Shark, OA an ophthalmic technician. The creation of this  record is the provider's dictation and/or activities during the visit.    Electronically signed by: San Jetty. Owens Shark, [place] 10.23.2023 4:27 PM  Gardiner Sleeper, M.D., Ph.D. Diseases & Surgery of the Retina and Vitreous [employer]  I have reviewed the above documentation for accuracy and completeness, and I agree with the above. Gardiner Sleeper, M.D., Ph.D. 02/15/22 4:43 PM  Abbreviations: M myopia (nearsighted); A astigmatism; H hyperopia (farsighted); P presbyopia; Mrx spectacle prescription;  CTL contact lenses; OD right eye; OS left eye; OU both eyes  XT exotropia; ET esotropia; PEK punctate epithelial keratitis; PEE punctate epithelial erosions; DES dry eye syndrome; MGD meibomian gland dysfunction; ATs artificial tears; PFAT's preservative free artificial tears; Early nuclear sclerotic cataract; PSC posterior subcapsular cataract; ERM epi-retinal membrane; PVD posterior vitreous detachment; RD retinal detachment; DM diabetes mellitus; DR diabetic retinopathy; NPDR non-proliferative diabetic retinopathy; PDR proliferative diabetic retinopathy; CSME clinically significant macular edema; DME diabetic macular edema; dbh dot blot hemorrhages; CWS cotton wool spot; POAG primary open angle glaucoma; C/D cup-to-disc ratio; HVF humphrey visual field; GVF goldmann visual field; OCT optical coherence tomography; IOP intraocular pressure; BRVO Branch retinal vein occlusion; CRVO central retinal vein occlusion; CRAO central retinal artery occlusion; BRAO branch retinal artery occlusion; RT retinal tear; SB scleral buckle; PPV pars plana vitrectomy; VH Vitreous hemorrhage; PRP panretinal laser photocoagulation; IVK intravitreal kenalog; VMT vitreomacular traction; MH Macular hole;  NVD neovascularization of the disc; NVE neovascularization elsewhere; AREDS age related eye disease study; ARMD age related macular degeneration; POAG primary open angle glaucoma; EBMD epithelial/anterior basement  membrane dystrophy; ACIOL anterior chamber intraocular lens; IOL intraocular lens; PCIOL posterior chamber intraocular lens; Phaco/IOL phacoemulsification with intraocular lens placement; [place] photorefractive keratectomy; LASIK laser assisted in situ keratomileusis; HTN hypertension; DM diabetes mellitus; COPD chronic obstructive pulmonary disease

## 2022-02-15 ENCOUNTER — Encounter (INDEPENDENT_AMBULATORY_CARE_PROVIDER_SITE_OTHER): Payer: Self-pay | Admitting: Ophthalmology

## 2022-02-15 ENCOUNTER — Ambulatory Visit (INDEPENDENT_AMBULATORY_CARE_PROVIDER_SITE_OTHER): Payer: Medicare Other | Admitting: Ophthalmology

## 2022-02-15 DIAGNOSIS — H35033 Hypertensive retinopathy, bilateral: Secondary | ICD-10-CM | POA: Diagnosis not present

## 2022-02-15 DIAGNOSIS — H35373 Puckering of macula, bilateral: Secondary | ICD-10-CM | POA: Diagnosis not present

## 2022-02-15 DIAGNOSIS — I10 Essential (primary) hypertension: Secondary | ICD-10-CM

## 2022-02-15 DIAGNOSIS — H25813 Combined forms of age-related cataract, bilateral: Secondary | ICD-10-CM | POA: Diagnosis not present

## 2022-02-22 ENCOUNTER — Other Ambulatory Visit: Payer: Self-pay | Admitting: Internal Medicine

## 2022-03-23 DIAGNOSIS — H269 Unspecified cataract: Secondary | ICD-10-CM | POA: Diagnosis not present

## 2022-03-23 DIAGNOSIS — H2511 Age-related nuclear cataract, right eye: Secondary | ICD-10-CM | POA: Diagnosis not present

## 2022-03-23 DIAGNOSIS — H2512 Age-related nuclear cataract, left eye: Secondary | ICD-10-CM | POA: Diagnosis not present

## 2022-03-27 ENCOUNTER — Other Ambulatory Visit: Payer: Self-pay | Admitting: Internal Medicine

## 2022-03-30 DIAGNOSIS — H2512 Age-related nuclear cataract, left eye: Secondary | ICD-10-CM | POA: Diagnosis not present

## 2022-03-30 DIAGNOSIS — Z961 Presence of intraocular lens: Secondary | ICD-10-CM | POA: Diagnosis not present

## 2022-03-30 DIAGNOSIS — H269 Unspecified cataract: Secondary | ICD-10-CM | POA: Diagnosis not present

## 2022-04-28 ENCOUNTER — Other Ambulatory Visit: Payer: Self-pay | Admitting: Internal Medicine

## 2022-04-28 NOTE — Telephone Encounter (Signed)
Sent in a 30 day supply & sent mychart message for pt to schedule a followup appt with PCP.  Last seen in Feb 2023

## 2022-05-25 ENCOUNTER — Telehealth: Payer: Self-pay

## 2022-05-25 ENCOUNTER — Ambulatory Visit (INDEPENDENT_AMBULATORY_CARE_PROVIDER_SITE_OTHER): Payer: Medicare Other

## 2022-05-25 ENCOUNTER — Ambulatory Visit (INDEPENDENT_AMBULATORY_CARE_PROVIDER_SITE_OTHER): Payer: Medicare Other | Admitting: Internal Medicine

## 2022-05-25 ENCOUNTER — Encounter: Payer: Self-pay | Admitting: Internal Medicine

## 2022-05-25 VITALS — BP 144/82 | HR 83 | Temp 98.3°F | Ht 64.5 in | Wt 155.4 lb

## 2022-05-25 DIAGNOSIS — R059 Cough, unspecified: Secondary | ICD-10-CM | POA: Insufficient documentation

## 2022-05-25 DIAGNOSIS — R051 Acute cough: Secondary | ICD-10-CM

## 2022-05-25 LAB — POCT INFLUENZA A/B
Influenza A, POC: NEGATIVE
Influenza B, POC: NEGATIVE

## 2022-05-25 LAB — POC COVID19 BINAXNOW: SARS Coronavirus 2 Ag: POSITIVE — AB

## 2022-05-25 MED ORDER — PREDNISONE 10 MG PO TABS: ORAL_TABLET | ORAL | 0 refills | Status: DC

## 2022-05-25 MED ORDER — BENZONATATE 200 MG PO CAPS: 200.0000 mg | ORAL_CAPSULE | Freq: Three times a day (TID) | ORAL | 1 refills | Status: DC | PRN

## 2022-05-25 MED ORDER — MOLNUPIRAVIR EUA 200MG CAPSULE: 4.0000 | ORAL_CAPSULE | Freq: Two times a day (BID) | ORAL | 0 refills | Status: AC

## 2022-05-25 NOTE — Telephone Encounter (Signed)
Per note from Dr Nicki Reaper on 2/6 she recommended at 4 month follow up

## 2022-05-25 NOTE — Progress Notes (Signed)
Subjective:  Patient ID: Debra Barr, female    DOB: February 13, 1946  Age: 77 y.o. MRN: [id number]  CC: The primary encounter diagnosis was Acute cough. A diagnosis of Cough in adult was also pertinent to this visit.   HPI Debra Barr presents for evaluation of a persistent cough  Chief Complaint  Patient presents with   Cough    Cough, congestion, chest congestion   [name] is a healthy, COVID vaccinated 77 yr old female who presents today with Non productive Cough has been present on and off since early December .  She states that the cough started after exposure to 2 grandchildren who tested positive for COVId prior to Christmas holidays.  She and husband and  developed mild respiratory illness but did not test.   Symptoms are fatigue due to lack of sleep ,  dry cough. . Has not been using otc  medication   denies shortness of breath , chest pain, wheezing and fevers.  She has had dysgeusia since her first COVID  infection in 2021   Had cataract surgery in December  , was not swabbed.  and the eye drops made her cough    Outpatient Medications Prior to Visit  Medication Sig Dispense Refill   amLODipine (NORVASC) 10 MG tablet TAKE 1 TABLET BY MOUTH DAILY 30 tablet 0   rosuvastatin (CRESTOR) 5 MG tablet TAKE 1 TABLET BY MOUTH DAILY 90 tablet 1   No facility-administered medications prior to visit.    Review of Systems;  Patient denies headache, fevers, malaise, unintentional weight loss, skin rash, eye pain, sinus congestion and sinus pain, sore throat, dysphagia,  hemoptysis ,  dyspnea, wheezing, chest pain, palpitations, orthopnea, edema, abdominal pain, nausea, melena, diarrhea, constipation, flank pain, dysuria, hematuria, urinary  Frequency, nocturia, numbness, tingling, seizures,  Focal weakness, Loss of consciousness,  Tremor, insomnia, depression, anxiety, and suicidal ideation.      Objective:  BP (!) 144/82   Pulse 83   Temp 98.3 F (36.8 C) (Oral)   Ht 5' 4.5"  (1.638 m)   Wt 155 lb 6.4 oz (70.5 kg)   SpO2 97%   BMI 26.26 kg/m   BP Readings from Last 3 Encounters:  05/25/22 (!) 144/82  06/01/21 126/72  01/28/21 118/78    Wt Readings from Last 3 Encounters:  05/25/22 155 lb 6.4 oz (70.5 kg)  06/01/21 164 lb (74.4 kg)  05/28/21 161 lb (73 kg)    Physical Exam Vitals reviewed.  Constitutional:      General: She is not in acute distress.    Appearance: Normal appearance. She is normal weight. She is not ill-appearing, toxic-appearing or diaphoretic.  HENT:     Head: Normocephalic.     Right Ear: Tympanic membrane normal.     Left Ear: Tympanic membrane normal.  Eyes:     General: No scleral icterus.       Right eye: No discharge.        Left eye: No discharge.     Conjunctiva/sclera: Conjunctivae normal.  Cardiovascular:     Rate and Rhythm: Normal rate and regular rhythm.     Heart sounds: Normal heart sounds.  Pulmonary:     Effort: Pulmonary effort is normal. No respiratory distress.     Breath sounds: Normal breath sounds. No wheezing, rhonchi or rales.  Musculoskeletal:        General: Normal range of motion.     Cervical back: Normal range of motion and neck supple.  Lymphadenopathy:     Cervical: No cervical adenopathy.  Skin:    General: Skin is warm and dry.  Neurological:     General: No focal deficit present.     Mental Status: She is alert and oriented to person, place, and time. Mental status is at baseline.  Psychiatric:        Mood and Affect: Mood normal.        Behavior: Behavior normal.        Thought Content: Thought content normal.        Judgment: Judgment normal.     No results found for: "HGBA1C"  Lab Results  Component Value Date   CREATININE 0.68 06/01/2021   CREATININE 0.62 11/27/2020   CREATININE 0.66 12/07/2018    Lab Results  Component Value Date   WBC 5.5 11/27/2020   HGB 14.5 11/27/2020   HCT 44.9 11/27/2020   PLT 313.0 11/27/2020   GLUCOSE 82 06/01/2021   CHOL 171 06/01/2021    TRIG 83.0 06/01/2021   HDL 65.00 06/01/2021   LDLCALC 89 06/01/2021   ALT 13 06/01/2021   AST 14 06/01/2021   NA 143 06/01/2021   K 4.0 06/01/2021   CL 102 06/01/2021   CREATININE 0.68 06/01/2021   BUN 13 06/01/2021   CO2 35 (H) 06/01/2021   TSH 1.19 11/27/2020       Assessment & Plan:  .Acute cough -     POC COVID-19 BinaxNow -     POCT Influenza A/B -     Respiratory virus panel  Cough in adult Assessment & Plan: HER COUGH has been present since early December at which time she developed a mild respiratory illness after coming in contact with COVID positive grandchildren .  The positive test today , in the absence of  body aches, fevers  is likely holdover and she has not been maskng for the last several weeks.  Advised to mask for 5 days,  take the molnupiravir.  Chest x ray today to rule out concurrent atypical pneumonia.  Tessalon perles and prednisone also sent to pharmacy  Orders: -     DG Chest 2 View; Future  Other orders -     predniSONE; 6 tablets on Day 1 , then reduce by 1 tablet daily until gone  Dispense: 21 tablet; Refill: 0 -     molnupiravir EUA; Take 4 capsules (800 mg total) by mouth 2 (two) times daily for 5 days.  Dispense: 40 capsule; Refill: 0 -     Benzonatate; Take 1 capsule (200 mg total) by mouth 3 (three) times daily as needed for cough.  Dispense: 60 capsule; Refill: 1     I provided 30 minutes of face-to-face time during this encounter reviewing patient's last visit with Dr Nicki Reaper, recent surgical and non surgical procedures, previous  labs and imaging studies, counseling on currently addressed issues,  and post visit ordering to diagnostics and therapeutics .   Follow-up: No follow-ups on file.   Debra Mc, MD

## 2022-05-25 NOTE — Telephone Encounter (Signed)
Patient has been scheduled for a physical with Dr. Nicki Reaper on 07/19/2022.

## 2022-05-25 NOTE — Assessment & Plan Note (Signed)
HER COUGH has been present since early December at which time she developed a mild respiratory illness after coming in contact with COVID positive grandchildren .  The positive test today , in the absence of  body aches, fevers  is likely holdover and she has not been maskng for the last several weeks.  Advised to mask for 5 days,  take the molnupiravir.  Chest x ray today to rule out concurrent atypical pneumonia.  Tessalon perles and prednisone also sent to pharmacy

## 2022-05-25 NOTE — Telephone Encounter (Signed)
Noted  

## 2022-05-25 NOTE — Telephone Encounter (Signed)
Patient was seen by Dr. Deborra Medina today and we noticed at check-out that patient does not have a future appointment scheduled with her PCP, Dr. Einar Pheasant.  This does not seem normal, so we would like to check with Dr. Nicki Reaper to see how she would like to schedule patient.

## 2022-05-25 NOTE — Patient Instructions (Addendum)
Your COVID test was positive,  but this is likely a HOLDOVER from December  I am sending you for  a chest x ray to rule out pneumonia   Start the prednisone this afternoon or tomorrow morning  Use the benzonotate capsules to suppress the cough  I have also sent the antiviral if you decide you want to take it  If the chest  x ray shows a pneumonia I will add an antibiotic

## 2022-05-27 LAB — RESPIRATORY VIRUS PANEL

## 2022-05-31 ENCOUNTER — Other Ambulatory Visit: Payer: Self-pay | Admitting: Internal Medicine

## 2022-06-04 ENCOUNTER — Telehealth: Payer: Self-pay | Admitting: Internal Medicine

## 2022-06-04 NOTE — Telephone Encounter (Signed)
Left message for patient to call back and schedule Medicare Annual Wellness Visit (AWV).   Please offer to do virtually or by telephone.  Left office number and my jabber [phone].  Last AWV:06/11/2022  Please schedule at anytime with Nurse Health Advisor.

## 2022-06-08 NOTE — Progress Notes (Signed)
[facility] Note  06/14/2022     CHIEF COMPLAINT Patient presents for Retina Follow Up   HISTORY OF PRESENT ILLNESS: Debra Barr is a 77 y.o. female who presents to the clinic today for:   HPI     Retina Follow Up   Patient presents with  Other.  In both eyes.  Severity is moderate.  Duration of 4 months.  Since onset it is gradually improving.  I, the attending physician,  performed the HPI with the patient and updated documentation appropriately.        Comments   Pt here for 4 mo ret f/u ERM OU. Pt states VA seems a bit better. Color distortions still come and go OU. Not seeing the blue distortion as much.       Last edited by Bernarda Caffey, MD on 06/14/2022  7:37 PM.    Pt has had cataract sx OU with Dr. Lucita Ferrara, she feels like it has improved her vision, she states she has noticed wavy lines in her hardwood floors at home, but not when she's reading  Referring physician: Einar Pheasant, [id number] [place],  [place] [address]  HISTORICAL INFORMATION:   Selected notes from the [facility] Referred by Dr. Lucita Ferrara for ERM OU / cataract clearance OU LEE:  Ocular Hx- PMH-    CURRENT MEDICATIONS: No current outpatient medications on file. (Ophthalmic Drugs)   No current facility-administered medications for this visit. (Ophthalmic Drugs)   Current Outpatient Medications (Other)  Medication Sig   amLODipine (NORVASC) 10 MG tablet TAKE 1 TABLET BY MOUTH DAILY   benzonatate (TESSALON) 200 MG capsule Take 1 capsule (200 mg total) by mouth 3 (three) times daily as needed for cough.   rosuvastatin (CRESTOR) 5 MG tablet TAKE 1 TABLET BY MOUTH DAILY   predniSONE (DELTASONE) 10 MG tablet 6 tablets on Day 1 , then reduce by 1 tablet daily until gone (Patient not taking: Reported on 06/14/2022)   No current facility-administered medications for this visit. (Other)   REVIEW OF SYSTEMS: ROS    Positive for: Cardiovascular, Eyes Negative for: Constitutional, Gastrointestinal, Neurological, Skin, Genitourinary, Musculoskeletal, HENT, Endocrine, Respiratory, Psychiatric, Allergic/Imm, Heme/Lymph Last edited by Kingsley Spittle, COT on 06/14/2022 12:42 PM.     ALLERGIES No Known Allergies  PAST MEDICAL HISTORY Past Medical History:  Diagnosis Date   Arthritis    Past Surgical History:  Procedure Laterality Date   NO PAST SURGERIES     FAMILY HISTORY Family History  Problem Relation Age of Onset   Arthritis Mother    Early death Father    Hyperlipidemia Father    Hypertension Brother    Kidney disease Brother    SOCIAL HISTORY Social History   Tobacco Use   Smoking status: Never   Smokeless tobacco: Never  Vaping Use   Vaping Use: Never used  Substance Use Topics   Alcohol use: Yes   Drug use: Never       OPHTHALMIC EXAM:  Base Eye Exam     Visual Acuity (Snellen - Linear)       Right Left   Dist [place] 20/40 +2 20/40   Dist ph [place] NI NI         Tonometry (Tonopen, 12:47 PM)       Right Left   Pressure 13 13         Pupils       Dark Light Shape React APD  Right 2 1 Round Minimal None   Left 2 1 Round Minimal None         Visual Fields (Counting fingers)       Left Right    Full Full         Extraocular Movement       Right Left    Full, Ortho Full, Ortho         Neuro/Psych     Oriented x3: Yes   Mood/Affect: Normal         Dilation     Both eyes: 1.0% Mydriacyl, 2.5% Phenylephrine @ 12:48 PM           Slit Lamp and Fundus Exam     Slit Lamp Exam       Right Left   Lids/Lashes Dermatochalasis - upper lid Dermatochalasis - upper lid, mild MGD   Conjunctiva/Sclera White and quiet White and quiet   Cornea Mild tear film debris, trace PEE, well healed cataract wound 2-3+ tear film debris, 1-2+ Punctate epithelial erosions   Anterior Chamber deep, clear, narrow temporal angle deep, clear, narrow temporal  angle   Iris Round and dilated Round and dilated   Lens PC IOL in good position, trace posterior capsular opacification PC IOL in good position, trace posterior capsular opacification   Anterior Vitreous mild syneresis mild syneresis         Fundus Exam       Right Left   Disc mild Pallor, Sharp rim Pink and Sharp   C/D Ratio 0.6 0.6   Macula Flat, Blunted foveal reflex, mild ERM with mild striae, no heme Flat, Blunted foveal reflex, mild ERM with mild striae, no heme   Vessels attenuated, Tortuous attenuated, Tortuous   Periphery Attached, no heme Attached, No heme           IMAGING AND PROCEDURES  Imaging and Procedures for 06/14/2022  OCT, Retina - OU - Both Eyes       Right Eye Quality was good. Central Foveal Thickness: 438. Progression has been stable. Findings include no IRF, no SRF, abnormal foveal contour, epiretinal membrane, macular pucker (ERM with blunted foveal contour, central thickening and early pucker).   Left Eye Quality was good. Central Foveal Thickness: 428. Progression has worsened. Findings include no IRF, no SRF, abnormal foveal contour, epiretinal membrane, macular pucker (Mild progression of ERM with blunted foveal contour, central thickening and early pucker).   Notes *Images captured and stored on drive  Diagnosis / Impression:  OD: ERM with blunted foveal contour, central thickening and early pucker -- stable OS: Mild progression of ERM with blunted foveal contour, central thickening and early pucker  Clinical management:  See below  Abbreviations: NFP - Normal foveal profile. CME - cystoid macular edema. PED - pigment epithelial detachment. IRF - intraretinal fluid. SRF - subretinal fluid. EZ - ellipsoid zone. ERM - epiretinal membrane. ORA - outer retinal atrophy. ORT - outer retinal tubulation. SRHM - subretinal hyper-reflective material. IRHM - intraretinal hyper-reflective material            ASSESSMENT/PLAN:    ICD-10-CM   1.  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 Essential hypertension  I10     3. Hypertensive retinopathy of both eyes  H35.033     4. Combined forms of age-related cataract of both eyes  H25.813      Epiretinal membrane, both eyes  - mild ERM OU  w/ blunting of foveal contour, central retinal thickening and mild pucker OU - OS with mild interval progression - BCVA 20/40 OU from 20/50 OU -- post CEIOL - OCT shows OD: ERM with blunted foveal contour, central thickening and early pucker; OS: Mild progression of ERM with blunted foveal contour, central thickening and early pucker - mild metamorphopsia -- noted while looking at [facility] floors only - no indication for surgery at this time - monitor for now - f/u 4 mos, sooner prn -- DFE/OCT  2,3. Hypertensive retinopathy OU - discussed importance of tight BP control - monitor  4. Pseudophakia OU  - s/p CE/IOL OU (Dr. Lucita Ferrara, December, 2023)  - IOLs in good position, doing well  - monitor  Ophthalmic Meds Ordered this visit:  No orders of the defined types were placed in this encounter.    Return in about 4 months (around 10/13/2022) for f/u ERM OU, DFE, OCT.  There are no Patient Instructions on file for this visit.   Explained the diagnoses, plan, and follow up with the patient and they expressed understanding.  Patient expressed understanding of the importance of proper follow up care.   This document serves as a record of services personally performed by Gardiner Sleeper, MD, PhD. It was created on their behalf by Orvan Falconer, an ophthalmic technician. The creation of this record is the provider's dictation and/or activities during the visit.    Electronically signed by: Orvan Falconer, OA, 06/14/22  7:39 PM  This document serves as a record of services personally performed by Gardiner Sleeper, MD, PhD. It was created on their behalf by San Jetty. Owens Shark, OA an ophthalmic  technician. The creation of this record is the provider's dictation and/or activities during the visit.    Electronically signed by: San Jetty. Marguerita Merles 02.19.2024 7:39 PM  Gardiner Sleeper, M.D., Ph.D. Diseases & Surgery of the Retina and Vitreous [facility]  I have reviewed the above documentation for accuracy and completeness, and I agree with the above. Gardiner Sleeper, M.D., Ph.D. 06/14/22 7:43 PM   Abbreviations: M myopia (nearsighted); A astigmatism; H hyperopia (farsighted); P presbyopia; Mrx spectacle prescription;  CTL contact lenses; OD right eye; OS left eye; OU both eyes  XT exotropia; ET esotropia; PEK punctate epithelial keratitis; PEE punctate epithelial erosions; DES dry eye syndrome; MGD meibomian gland dysfunction; ATs artificial tears; PFAT's preservative free artificial tears; [place] nuclear sclerotic cataract; PSC posterior subcapsular cataract; ERM epi-retinal membrane; PVD posterior vitreous detachment; RD retinal detachment; DM diabetes mellitus; DR diabetic retinopathy; NPDR non-proliferative diabetic retinopathy; PDR proliferative diabetic retinopathy; CSME clinically significant macular edema; DME diabetic macular edema; dbh dot blot hemorrhages; CWS cotton wool spot; POAG primary open angle glaucoma; C/D cup-to-disc ratio; HVF humphrey visual field; GVF goldmann visual field; OCT optical coherence tomography; IOP intraocular pressure; BRVO Branch retinal vein occlusion; CRVO central retinal vein occlusion; CRAO central retinal artery occlusion; BRAO branch retinal artery occlusion; RT retinal tear; SB scleral buckle; PPV pars plana vitrectomy; VH Vitreous hemorrhage; PRP panretinal laser photocoagulation; IVK intravitreal kenalog; VMT vitreomacular traction; MH Macular hole;  NVD neovascularization of the disc; NVE neovascularization elsewhere; AREDS age related eye disease study; ARMD age related macular degeneration; POAG primary open angle glaucoma;  EBMD epithelial/anterior basement membrane dystrophy; ACIOL anterior chamber intraocular lens; IOL intraocular lens; PCIOL posterior chamber intraocular lens; Phaco/IOL phacoemulsification with intraocular lens placement; [place] photorefractive keratectomy; LASIK laser assisted in situ keratomileusis; HTN hypertension; DM diabetes mellitus; COPD  chronic obstructive pulmonary disease

## 2022-06-14 ENCOUNTER — Ambulatory Visit (INDEPENDENT_AMBULATORY_CARE_PROVIDER_SITE_OTHER): Payer: Medicare Other | Admitting: Ophthalmology

## 2022-06-14 ENCOUNTER — Encounter (INDEPENDENT_AMBULATORY_CARE_PROVIDER_SITE_OTHER): Payer: Self-pay | Admitting: Ophthalmology

## 2022-06-14 DIAGNOSIS — H35373 Puckering of macula, bilateral: Secondary | ICD-10-CM | POA: Diagnosis not present

## 2022-06-14 DIAGNOSIS — H35033 Hypertensive retinopathy, bilateral: Secondary | ICD-10-CM | POA: Diagnosis not present

## 2022-06-14 DIAGNOSIS — H25813 Combined forms of age-related cataract, bilateral: Secondary | ICD-10-CM | POA: Diagnosis not present

## 2022-06-14 DIAGNOSIS — I10 Essential (primary) hypertension: Secondary | ICD-10-CM | POA: Diagnosis not present

## 2022-06-25 ENCOUNTER — Telehealth: Payer: Self-pay | Admitting: Internal Medicine

## 2022-06-25 NOTE — Telephone Encounter (Signed)
Contacted TATIYANA SHERE to schedule their annual wellness visit. Appointment made for 07/05/2022.  Thank you,  [place] Direct dial  [phone]

## 2022-06-28 ENCOUNTER — Other Ambulatory Visit: Payer: Self-pay | Admitting: Internal Medicine

## 2022-07-05 ENCOUNTER — Ambulatory Visit (INDEPENDENT_AMBULATORY_CARE_PROVIDER_SITE_OTHER): Payer: Medicare Other

## 2022-07-05 VITALS — Ht 64.5 in | Wt 155.0 lb

## 2022-07-05 DIAGNOSIS — Z0000 Encounter for general adult medical examination without abnormal findings: Secondary | ICD-10-CM

## 2022-07-05 NOTE — Patient Instructions (Addendum)
Debra Barr , Thank you for taking time to come for your Medicare Wellness Visit. I appreciate your ongoing commitment to your health goals. Please review the following plan we discussed and let me know if I can assist you in the future.   These are the goals we discussed:  Goals      Follow up with Primary Care Provider     As needed        This is a list of the screening recommended for you and due dates:  Health Maintenance  Topic Date Due   Hepatitis C Screening: USPSTF Recommendation to screen - Ages 63-79 yo.  Never done   DTaP/Tdap/Td vaccine (2 - Td or Tdap) 09/07/2018   COVID-19 Vaccine (3 - 2023-24 season) 07/21/2022*   Flu Shot  07/25/2022*   Zoster (Shingles) Vaccine (1 of 2) 10/05/2022*   Medicare Annual Wellness Visit  07/05/2023   Pneumonia Vaccine  Completed   HPV Vaccine  Aged Out   DEXA scan (bone density measurement)  Discontinued  *Topic was postponed. The date shown is not the original due date.    Advanced directives: End of life planning; Advance aging; Advanced directives discussed.  Copy of current HCPOA/Living Will requested.    Conditions/risks identified: none new.  Next appointment: Follow up in one year for your annual wellness visit    Preventive Care 65 Years and Older, Female Preventive care refers to lifestyle choices and visits with your health care provider that can promote health and wellness. What does preventive care include? A yearly physical exam. This is also called an annual well check. Dental exams once or twice a year. Routine eye exams. Ask your health care provider how often you should have your eyes checked. Personal lifestyle choices, including: Daily care of your teeth and gums. Regular physical activity. Eating a healthy diet. Avoiding tobacco and drug use. Limiting alcohol use. Practicing safe sex. Taking low-dose aspirin every day. Taking vitamin and mineral supplements as recommended by your health care  provider. What happens during an annual well check? The services and screenings done by your health care provider during your annual well check will depend on your age, overall health, lifestyle risk factors, and family history of disease. Counseling  Your health care provider may ask you questions about your: Alcohol use. Tobacco use. Drug use. Emotional well-being. Home and relationship well-being. Sexual activity. Eating habits. History of falls. Memory and ability to understand (cognition). Work and work Statistician. Reproductive health. Screening  You may have the following tests or measurements: Height, weight, and BMI. Blood pressure. Lipid and cholesterol levels. These may be checked every 5 years, or more frequently if you are over 23 years old. Skin check. Lung cancer screening. You may have this screening every year starting at age 57 if you have a 30-pack-year history of smoking and currently smoke or have quit within the past 15 years. Fecal occult blood test (FOBT) of the stool. You may have this test every year starting at age 33. Flexible sigmoidoscopy or colonoscopy. You may have a sigmoidoscopy every 5 years or a colonoscopy every 10 years starting at age 74. Hepatitis C blood test. Hepatitis B blood test. Sexually transmitted disease (STD) testing. Diabetes screening. This is done by checking your blood sugar (glucose) after you have not eaten for a while (fasting). You may have this done every 1-3 years. Bone density scan. This is done to screen for osteoporosis. You may have this done starting at age 38.  Mammogram. This may be done every 1-2 years. Talk to your health care provider about how often you should have regular mammograms. Talk with your health care provider about your test results, treatment options, and if necessary, the need for more tests. Vaccines  Your health care provider may recommend certain vaccines, such as: Influenza vaccine. This is  recommended every year. Tetanus, diphtheria, and acellular pertussis (Tdap, Td) vaccine. You may need a Td booster every 10 years. Zoster vaccine. You may need this after age 27. Pneumococcal 13-valent conjugate (PCV13) vaccine. One dose is recommended after age 66. Pneumococcal polysaccharide (PPSV23) vaccine. One dose is recommended after age 65. Talk to your health care provider about which screenings and vaccines you need and how often you need them. This information is not intended to replace advice given to you by your health care provider. Make sure you discuss any questions you have with your health care provider. Document Released: 05/09/2015 Document Revised: 12/31/2015 Document Reviewed: 02/11/2015 Elsevier Interactive Patient Education  2017 [place] Prevention in the Home Falls can cause injuries. They can happen to people of all ages. There are many things you can do to make your home safe and to help prevent falls. What can I do on the outside of my home? Regularly fix the edges of walkways and driveways and fix any cracks. Remove anything that might make you trip as you walk through a door, such as a raised step or threshold. Trim any bushes or trees on the path to your home. Use bright outdoor lighting. Clear any walking paths of anything that might make someone trip, such as rocks or tools. Regularly check to see if handrails are loose or broken. Make sure that both sides of any steps have handrails. Any raised decks and porches should have guardrails on the edges. Have any leaves, snow, or ice cleared regularly. Use sand or salt on walking paths during winter. Clean up any spills in your garage right away. This includes oil or grease spills. What can I do in the bathroom? Use night lights. Install grab bars by the toilet and in the tub and shower. Do not use towel bars as grab bars. Use non-skid mats or decals in the tub or shower. If you need to sit down in  the shower, use a plastic, non-slip stool. Keep the floor dry. Clean up any water that spills on the floor as soon as it happens. Remove soap buildup in the tub or shower regularly. Attach bath mats securely with double-sided non-slip rug tape. Do not have throw rugs and other things on the floor that can make you trip. What can I do in the bedroom? Use night lights. Make sure that you have a light by your bed that is easy to reach. Do not use any sheets or blankets that are too big for your bed. They should not hang down onto the floor. Have a firm chair that has side arms. You can use this for support while you get dressed. Do not have throw rugs and other things on the floor that can make you trip. What can I do in the kitchen? Clean up any spills right away. Avoid walking on wet floors. Keep items that you use a lot in easy-to-reach places. If you need to reach something above you, use a strong step stool that has a grab bar. Keep electrical cords out of the way. Do not use floor polish or wax that makes floors slippery. If  you must use wax, use non-skid floor wax. Do not have throw rugs and other things on the floor that can make you trip. What can I do with my stairs? Do not leave any items on the stairs. Make sure that there are handrails on both sides of the stairs and use them. Fix handrails that are broken or loose. Make sure that handrails are as long as the stairways. Check any carpeting to make sure that it is firmly attached to the stairs. Fix any carpet that is loose or worn. Avoid having throw rugs at the top or bottom of the stairs. If you do have throw rugs, attach them to the floor with carpet tape. Make sure that you have a light switch at the top of the stairs and the bottom of the stairs. If you do not have them, ask someone to add them for you. What else can I do to help prevent falls? Wear shoes that: Do not have high heels. Have rubber bottoms. Are comfortable  and fit you well. Are closed at the toe. Do not wear sandals. If you use a stepladder: Make sure that it is fully opened. Do not climb a closed stepladder. Make sure that both sides of the stepladder are locked into place. Ask someone to hold it for you, if possible. Clearly mark and make sure that you can see: Any grab bars or handrails. First and last steps. Where the edge of each step is. Use tools that help you move around (mobility aids) if they are needed. These include: Canes. Walkers. Scooters. Crutches. Turn on the lights when you go into a dark area. Replace any light bulbs as soon as they burn out. Set up your furniture so you have a clear path. Avoid moving your furniture around. If any of your floors are uneven, fix them. If there are any pets around you, be aware of where they are. Review your medicines with your doctor. Some medicines can make you feel dizzy. This can increase your chance of falling. Ask your doctor what other things that you can do to help prevent falls. This information is not intended to replace advice given to you by your health care provider. Make sure you discuss any questions you have with your health care provider. Document Released: 02/06/2009 Document Revised: 09/18/2015 Document Reviewed: 05/17/2014 Elsevier Interactive Patient Education  2017 [employer].

## 2022-07-05 NOTE — Progress Notes (Signed)
Subjective:   Debra Barr is a 77 y.o. female who presents for Medicare Annual (Subsequent) preventive examination.  Review of Systems    No ROS.  Medicare Wellness Virtual Visit.  Visual/audio telehealth visit, UTA vital signs.   See social history for additional risk factors.   Cardiac Risk Factors include: advanced age (>68mn, >>55women);hypertension     Objective:    Today's Vitals   07/05/22 1055  Weight: 155 lb (70.3 kg)  Height: 5' 4.5" (1.638 m)   Body mass index is 26.19 kg/m.     07/05/2022   10:56 AM 05/28/2021   10:41 AM 05/27/2020   10:40 AM 05/25/2019   10:42 AM  Advanced Directives  Does Patient Have a Medical Advance Directive? Yes Yes No Yes  Type of AParamed[place]of AFalling [place]Living will [place]Port [place] North[place] will  Living will  Does patient want to make changes to medical advance directive? No - Patient declined No - Patient declined  No - Patient declined  Copy of HC[place]in Chart? No - copy requested No - copy requested    Would patient like information on creating a medical advance directive?   No - Patient declined     Current Medications (verified) Outpatient Encounter Medications as of 07/05/2022  Medication Sig   amLODipine (NORVASC) 10 MG tablet TAKE 1 TABLET BY MOUTH DAILY   benzonatate (TESSALON) 200 MG capsule Take 1 capsule (200 mg total) by mouth 3 (three) times daily as needed for cough.   predniSONE (DELTASONE) 10 MG tablet 6 tablets on Day 1 , then reduce by 1 tablet daily until gone (Patient not taking: Reported on 06/14/2022)   rosuvastatin (CRESTOR) 5 MG tablet TAKE 1 TABLET BY MOUTH DAILY   No facility-administered encounter medications on file as of 07/05/2022.    Allergies (verified) Patient has no known allergies.   History: Past Medical History:  Diagnosis Date   Arthritis    Past Surgical History:  Procedure Laterality Date   NO PAST SURGERIES     Family History   Problem Relation Age of Onset   Arthritis Mother    Early death Father    Hyperlipidemia Father    Hypertension Brother    Kidney disease Brother    Social History   Socioeconomic History   Marital status: Married    Spouse name: Not on file   Number of children: Not on file   Years of education: Not on file   Highest education level: Not on file  Occupational History   Not on file  Tobacco Use   Smoking status: Never   Smokeless tobacco: Never  Vaping Use   Vaping Use: Never used  Substance and Sexual Activity   Alcohol use: Yes   Drug use: Never   Sexual activity: Not Currently  Other Topics Concern   Not on file  Social History Narrative   Not on file   Social Determinants of Health   Financial Resource Strain: Low Risk  (07/05/2022)   Overall Financial Resource Strain (CARDIA)    Difficulty of Paying Living Expenses: Not hard at all  Food Insecurity: No Food Insecurity (07/05/2022)   Hunger Vital Sign    Worried About Running Out of Food in the Last Year: Never true    [place] the Last Year: Never true  Transportation Needs: No Transportation Needs (07/05/2022)   PRAPARE - THydrologist(Medical): No  Lack of Transportation (Non-Medical): No  Physical Activity: Not on file  Stress: No Stress Concern Present (07/05/2022)   [place]    Feeling of Stress : Not at all  Social Connections: Unknown (07/05/2022)   Social Connection and Isolation Panel [NHANES]    Frequency of Communication with Friends and Family: More than three times a week    Frequency of Social Gatherings with Friends and Family: More than three times a week    Attends Religious Services: Not on Advertising copywriter or Organizations: Not on file    Attends Archivist Meetings: Not on file    Marital Status: Married    Tobacco Counseling Counseling given: Not  Answered   Clinical Intake:  Pre-visit preparation completed: Yes        Diabetes: No  How often do you need to have someone help you when you read instructions, pamphlets, or other written materials from your doctor or pharmacy?: 1 - Never    Interpreter Needed?: No      Activities of Daily Living    07/05/2022   11:01 AM  In your present state of health, do you have any difficulty performing the following activities:  Hearing? 0  Vision? 0  Difficulty concentrating or making decisions? 0  Walking or climbing stairs? 0  Dressing or bathing? 0  Doing errands, shopping? 0  Preparing Food and eating ? N  Using the Toilet? N  In the past six months, have you accidently leaked urine? Y  Comment Stress incontinence. Managed with daily liner.  Do you have problems with loss of bowel control? N  Managing your Medications? N  Managing your Finances? N  Housekeeping or managing your Housekeeping? N    Patient Care Team: Einar Pheasant, MD as PCP - General (Internal Medicine)  Indicate any recent Medical Services you may have received from other than [facility] providers in the past year (date may be approximate).     Assessment:   This is a routine wellness examination for Debra Barr.  I connected with  Debra Barr on 07/05/22 by a audio enabled telemedicine application and verified that I am speaking with the correct person using two identifiers.  Patient Location: Home  Provider Location: Office/Clinic  I discussed the limitations of evaluation and management by telemedicine. The patient expressed understanding and agreed to proceed.   Hearing/Vision screen Hearing Screening - Comments:: Patient is able to hear conversational tones without difficulty. No issues reported. Vision Screening - Comments:: Cataract extraction, bilateral Wears reader lenses  Dietary issues and exercise activities discussed: Current Exercise Habits: Home exercise routine, Type of  exercise: walking, Time (Minutes): > 60, Frequency (Times/Week): 5, Weekly Exercise (Minutes/Week): 0, Intensity: Mild   Goals Addressed             This Visit's Progress    Follow up with Primary Care Provider       As needed       Depression Screen    07/05/2022   11:10 AM 05/25/2022   12:15 PM 05/28/2021   10:44 AM 01/28/2021   11:22 AM 11/27/2020   11:34 AM 05/27/2020   10:38 AM 05/25/2019   10:44 AM  PHQ 2/9 Scores  PHQ - 2 Score 0 0 0 0 0 0 0    Fall Risk    07/05/2022   11:00 AM 05/25/2022   12:15 PM 05/28/2021   10:44 AM 01/28/2021  11:22 AM 11/27/2020   11:34 AM  Fall Risk   Falls in the past year? 0 0 0 0 0  Number falls in past yr: 0 0 0 0 0  Injury with Fall? 0 0  0 0  Risk for fall due to :  No Fall Risks     Follow up Falls evaluation completed;Falls prevention discussed Falls evaluation completed Falls evaluation completed Falls evaluation completed Falls evaluation completed    FALL RISK PREVENTION PERTAINING TO THE HOME: Home free of loose throw rugs in walkways, pet beds, electrical cords, etc? Yes  Adequate lighting in your home to reduce risk of falls? Yes   ASSISTIVE DEVICES UTILIZED TO PREVENT FALLS: Life alert? No  Use of a cane, walker or w/c? No  Grab bars in the bathroom? Yes  Shower chair or bench in shower? No  Elevated toilet seat or a handicapped toilet? No   TIMED UP AND GO: Was the test performed? No .   Cognitive Function:        07/05/2022   11:10 AM 05/28/2021   11:06 AM 05/27/2020   10:42 AM 05/25/2019   10:51 AM  6CIT Screen  What Year? 0 points 0 points  0 points  What month? 0 points 0 points  0 points  What time? 0 points 0 points  0 points  Count back from 20 0 points 0 points  0 points  Months in reverse 0 points 0 points 0 points 0 points  Repeat phrase 0 points 0 points  0 points  Total Score 0 points 0 points  0 points    Immunizations Immunization History  Administered Date(s) Administered   Fluad Quad(high Dose  65+) 04/02/2021   PFIZER(Purple Top)SARS-COV-2 Vaccination 06/18/2019, 07/09/2019   Pneumococcal Conjugate-13 12/07/2018   Pneumococcal Polysaccharide-23 06/01/2021   Tdap 09/06/2008   TDAP status: Due, Education has been provided regarding the importance of this vaccine. Advised may receive this vaccine at local pharmacy or Health Dept. Aware to provide a copy of the vaccination record if obtained from local pharmacy or Health Dept. Verbalized acceptance and understanding.  Shingrix Completed?: No.    Education has been provided regarding the importance of this vaccine. Patient has been advised to call insurance company to determine out of pocket expense if they have not yet received this vaccine. Advised may also receive vaccine at local pharmacy or Health Dept. Verbalized acceptance and understanding.  Screening Tests Health Maintenance  Topic Date Due   Hepatitis C Screening  Never done   DTaP/Tdap/Td (2 - Td or Tdap) 09/07/2018   COVID-19 Vaccine (3 - 2023-24 season) 07/21/2022 (Originally 12/25/2021)   INFLUENZA VACCINE  07/25/2022 (Originally 11/24/2021)   Zoster Vaccines- Shingrix (1 of 2) 10/05/2022 (Originally 07/26/1994)   Medicare Annual Wellness (AWV)  07/05/2023   Pneumonia Vaccine 41+ Years old  Completed   HPV VACCINES  Aged Out   DEXA SCAN  Discontinued   Health Maintenance Health Maintenance Due  Topic Date Due   Hepatitis C Screening  Never done   DTaP/Tdap/Td (2 - Td or Tdap) 09/07/2018    Mammogram- deferred per patient preference.  Lung Cancer Screening: (Low Dose CT Chest recommended if Age 33-80 years, 30 pack-year currently smoking OR have quit w/in 15years.) does not qualify.   Hepatitis C Screening: deferred per patient preference.    Vision Screening: Recommended annual ophthalmology exams for early detection of glaucoma and other disorders of the eye.  Dental Screening: Recommended annual dental exams for  proper oral hygiene  Community Resource Referral  / Chronic Care Management: CRR required this visit?  No   CCM required this visit?  No      Plan:     I have personally reviewed and noted the following in the patient's chart:   Medical and social history Use of alcohol, tobacco or illicit drugs  Current medications and supplements including opioid prescriptions. Patient is not currently taking opioid prescriptions. Functional ability and status Nutritional status Physical activity Advanced directives List of other physicians Hospitalizations, surgeries, and ER visits in previous 12 months Vitals Screenings to include cognitive, depression, and falls Referrals and appointments  In addition, I have reviewed and discussed with patient certain preventive protocols, quality metrics, and best practice recommendations. A written personalized care plan for preventive services as well as general preventive health recommendations were provided to patient.     Leta Jungling, LPN   [id number]

## 2022-07-13 ENCOUNTER — Encounter: Payer: Self-pay | Admitting: Internal Medicine

## 2022-07-19 ENCOUNTER — Encounter: Payer: Medicare Other | Admitting: Internal Medicine

## 2022-07-26 ENCOUNTER — Other Ambulatory Visit: Payer: Self-pay | Admitting: Internal Medicine

## 2022-08-10 ENCOUNTER — Encounter: Payer: Medicare Other | Admitting: Internal Medicine

## 2022-08-12 ENCOUNTER — Encounter: Payer: Self-pay | Admitting: Internal Medicine

## 2022-08-23 ENCOUNTER — Encounter: Payer: Medicare Other | Admitting: Internal Medicine

## 2022-08-30 ENCOUNTER — Other Ambulatory Visit: Payer: Self-pay | Admitting: Internal Medicine

## 2022-09-10 ENCOUNTER — Ambulatory Visit (INDEPENDENT_AMBULATORY_CARE_PROVIDER_SITE_OTHER): Payer: Medicare Other | Admitting: Internal Medicine

## 2022-09-10 ENCOUNTER — Encounter: Payer: Self-pay | Admitting: Internal Medicine

## 2022-09-10 VITALS — BP 128/70 | HR 89 | Temp 97.9°F | Resp 16 | Ht 64.5 in | Wt 159.0 lb

## 2022-09-10 DIAGNOSIS — R748 Abnormal levels of other serum enzymes: Secondary | ICD-10-CM

## 2022-09-10 DIAGNOSIS — E538 Deficiency of other specified B group vitamins: Secondary | ICD-10-CM | POA: Diagnosis not present

## 2022-09-10 DIAGNOSIS — E7800 Pure hypercholesterolemia, unspecified: Secondary | ICD-10-CM

## 2022-09-10 DIAGNOSIS — I10 Essential (primary) hypertension: Secondary | ICD-10-CM | POA: Diagnosis not present

## 2022-09-10 DIAGNOSIS — R413 Other amnesia: Secondary | ICD-10-CM

## 2022-09-10 DIAGNOSIS — Z0000 Encounter for general adult medical examination without abnormal findings: Secondary | ICD-10-CM | POA: Diagnosis not present

## 2022-09-10 LAB — CBC WITH DIFFERENTIAL/PLATELET
Basophils Absolute: 0 10*3/uL (ref 0.0–0.1)
Basophils Relative: 0.5 % (ref 0.0–3.0)
Eosinophils Absolute: 0.1 10*3/uL (ref 0.0–0.7)
Eosinophils Relative: 1.3 % (ref 0.0–5.0)
HCT: 42.7 % (ref 36.0–46.0)
Hemoglobin: 14.1 g/dL (ref 12.0–15.0)
Lymphocytes Relative: 40.5 % (ref 12.0–46.0)
Lymphs Abs: 2.2 10*3/uL (ref 0.7–4.0)
MCHC: 33.1 g/dL (ref 30.0–36.0)
MCV: 87 fl (ref 78.0–100.0)
Monocytes Absolute: 0.4 10*3/uL (ref 0.1–1.0)
Monocytes Relative: 7 % (ref 3.0–12.0)
Neutro Abs: 2.7 10*3/uL (ref 1.4–7.7)
Neutrophils Relative %: 50.7 % (ref 43.0–77.0)
Platelets: 378 10*3/uL (ref 150.0–400.0)
RBC: 4.91 Mil/uL (ref 3.87–5.11)
RDW: 15.2 % (ref 11.5–15.5)
WBC: 5.4 10*3/uL (ref 4.0–10.5)

## 2022-09-10 LAB — HEPATIC FUNCTION PANEL
ALT: 10 U/L (ref 0–35)
AST: 14 U/L (ref 0–37)
Albumin: 4.2 g/dL (ref 3.5–5.2)
Alkaline Phosphatase: 131 U/L — ABNORMAL HIGH (ref 39–117)
Bilirubin, Direct: 0.1 mg/dL (ref 0.0–0.3)
Total Bilirubin: 0.5 mg/dL (ref 0.2–1.2)
Total Protein: 7.6 g/dL (ref 6.0–8.3)

## 2022-09-10 LAB — TSH: TSH: 0.81 u[IU]/mL (ref 0.35–5.50)

## 2022-09-10 LAB — BASIC METABOLIC PANEL
BUN: 21 mg/dL (ref 6–23)
CO2: 30 mEq/L (ref 19–32)
Calcium: 9.4 mg/dL (ref 8.4–10.5)
Chloride: 103 mEq/L (ref 96–112)
Creatinine, Ser: 0.75 mg/dL (ref 0.40–1.20)
GFR: 76.41 mL/min (ref >60.00)
Glucose, Bld: 74 mg/dL (ref 70–99)
Potassium: 3.6 mEq/L (ref 3.5–5.1)
Sodium: 141 mEq/L (ref 135–145)

## 2022-09-10 LAB — LIPID PANEL
Cholesterol: 154 mg/dL (ref 0–200)
HDL: 67 mg/dL (ref >39.00)
LDL Cholesterol: 71 mg/dL (ref 0–99)
NonHDL: 86.63
Total CHOL/HDL Ratio: 2
Triglycerides: 79 mg/dL (ref 0.0–149.0)
VLDL: 15.8 mg/dL (ref 0.0–40.0)

## 2022-09-10 LAB — VITAMIN B12: Vitamin B-12: 1005 pg/mL — ABNORMAL HIGH (ref 211–911)

## 2022-09-10 MED ORDER — ROSUVASTATIN CALCIUM 5 MG PO TABS: 5.0000 mg | ORAL_TABLET | Freq: Every day | ORAL | 1 refills | Status: DC

## 2022-09-10 MED ORDER — AMLODIPINE BESYLATE 10 MG PO TABS: ORAL_TABLET | ORAL | 1 refills | Status: DC

## 2022-09-10 NOTE — Progress Notes (Unsigned)
Subjective:    Patient ID: Debra Barr, female    DOB: November 14, 1945, 77 y.o.   MRN: [id number]  Patient here for  Chief Complaint  Patient presents with   Annual Exam    HPI Here for a physical exam. Reports she is doing relatively well. No chest pain or sob reported.  No cough or congestion.  Discussed memory.  Does not feel is a significant issue.  Discussed further evaluation.  Will notify me if desires further testing/evaluation.  Continues to decline mammogram.  Declines colonoscopy.     Past Medical History:  Diagnosis Date   Arthritis    Past Surgical History:  Procedure Laterality Date   NO PAST SURGERIES     Family History  Problem Relation Age of Onset   Arthritis Mother    Early death Father    Hyperlipidemia Father    Hypertension Brother    Kidney disease Brother    Social History   Socioeconomic History   Marital status: Married    Spouse name: Not on file   Number of children: Not on file   Years of education: Not on file   Highest education level: Not on file  Occupational History   Not on file  Tobacco Use   Smoking status: Never   Smokeless tobacco: Never  Vaping Use   Vaping Use: Never used  Substance and Sexual Activity   Alcohol use: Yes   Drug use: Never   Sexual activity: Not Currently  Other Topics Concern   Not on file  Social History Narrative   Not on file   Social Determinants of Health   Financial Resource Strain: Low Risk  (07/05/2022)   Overall Financial Resource Strain (CARDIA)    Difficulty of Paying Living Expenses: Not hard at all  Food Insecurity: No Food Insecurity (07/05/2022)   Hunger Vital Sign    Worried About Running Out of Food in the Last Year: Never true    Ran Out of Food in the Last Year: Never true  Transportation Needs: No Transportation Needs (07/05/2022)   PRAPARE - Administrator, Civil Service (Medical): No    Lack of Transportation (Non-Medical): No  Physical Activity: Not on file   Stress: No Stress Concern Present (07/05/2022)   [employer] of Occupational Health - Occupational Stress Questionnaire    Feeling of Stress : Not at all  Social Connections: Unknown (07/05/2022)   Social Connection and Isolation Panel [NHANES]    Frequency of Communication with Friends and Family: More than three times a week    Frequency of Social Gatherings with Friends and Family: More than three times a week    Attends Religious Services: Not on Marketing executive or Organizations: Not on file    Attends Banker Meetings: Not on file    Marital Status: Married     Review of Systems  Constitutional:  Negative for appetite change and unexpected weight change.  HENT:  Negative for congestion, sinus pressure and sore throat.   Eyes:  Negative for pain and visual disturbance.  Respiratory:  Negative for cough, chest tightness and shortness of breath.   Cardiovascular:  Negative for chest pain and palpitations.  Gastrointestinal:  Negative for abdominal pain, diarrhea, nausea and vomiting.  Genitourinary:  Negative for difficulty urinating and dysuria.  Musculoskeletal:  Negative for joint swelling and myalgias.  Skin:  Negative for color change and rash.  Neurological:  Negative for  dizziness and headaches.  Hematological:  Negative for adenopathy. Does not bruise/bleed easily.  Psychiatric/Behavioral:  Negative for agitation and dysphoric mood.        Objective:     BP 128/70   Pulse 89   Temp 97.9 F (36.6 C)   Resp 16   Ht 5' 4.5" (1.638 m)   Wt 159 lb (72.1 kg)   SpO2 98%   BMI 26.87 kg/m  Wt Readings from Last 3 Encounters:  09/10/22 159 lb (72.1 kg)  07/05/22 155 lb (70.3 kg)  05/25/22 155 lb 6.4 oz (70.5 kg)    Physical Exam Vitals reviewed.  Constitutional:      General: She is not in acute distress.    Appearance: Normal appearance. She is well-developed.  HENT:     Head: Normocephalic and atraumatic.     Right Ear:  External ear normal.     Left Ear: External ear normal.  Eyes:     General: No scleral icterus.       Right eye: No discharge.        Left eye: No discharge.     Conjunctiva/sclera: Conjunctivae normal.  Neck:     Thyroid: No thyromegaly.  Cardiovascular:     Rate and Rhythm: Normal rate and regular rhythm.  Pulmonary:     Effort: No tachypnea, accessory muscle usage or respiratory distress.     Breath sounds: Normal breath sounds. No decreased breath sounds or wheezing.  Chest:  Breasts:    Right: No inverted nipple, mass, nipple discharge or tenderness (no axillary adenopathy).     Left: No inverted nipple, mass, nipple discharge or tenderness (no axilarry adenopathy).  Abdominal:     General: Bowel sounds are normal.     Palpations: Abdomen is soft.     Tenderness: There is no abdominal tenderness.  Musculoskeletal:        General: No swelling or tenderness.     Cervical back: Neck supple.  Lymphadenopathy:     Cervical: No cervical adenopathy.  Skin:    Findings: No erythema or rash.  Neurological:     Mental Status: She is alert and oriented to person, place, and time.  Psychiatric:        Mood and Affect: Mood normal.        Behavior: Behavior normal.      Outpatient Encounter Medications as of 09/10/2022  Medication Sig   amLODipine (NORVASC) 10 MG tablet TAKE 1 TABLET BY MOUTH DAILY   rosuvastatin (CRESTOR) 5 MG tablet Take 1 tablet (5 mg total) by mouth daily.   [DISCONTINUED] amLODipine (NORVASC) 10 MG tablet TAKE 1 TABLET BY MOUTH DAILY **MUST KEEP APPOINTMENT FOR ADDITIONAL REFILLS**   [DISCONTINUED] benzonatate (TESSALON) 200 MG capsule Take 1 capsule (200 mg total) by mouth 3 (three) times daily as needed for cough.   [DISCONTINUED] predniSONE (DELTASONE) 10 MG tablet 6 tablets on Day 1 , then reduce by 1 tablet daily until gone (Patient not taking: Reported on 06/14/2022)   [DISCONTINUED] rosuvastatin (CRESTOR) 5 MG tablet TAKE 1 TABLET BY MOUTH DAILY   No  facility-administered encounter medications on file as of 09/10/2022.     Lab Results  Component Value Date   WBC 5.4 09/10/2022   HGB 14.1 09/10/2022   HCT 42.7 09/10/2022   PLT 378.0 09/10/2022   GLUCOSE 74 09/10/2022   CHOL 154 09/10/2022   TRIG 79.0 09/10/2022   HDL 67.00 09/10/2022   LDLCALC 71 09/10/2022   ALT 10 09/10/2022  AST 14 09/10/2022   NA 141 09/10/2022   K 3.6 09/10/2022   CL 103 09/10/2022   CREATININE 0.75 09/10/2022   BUN 21 09/10/2022   CO2 30 09/10/2022   TSH 0.81 09/10/2022       Assessment & Plan:  Routine general medical examination at a health care facility  Hypercholesterolemia Assessment & Plan: On crestor.  Follow lipid panel and liver function tests.    Orders: -     CBC with Differential/Platelet -     Hepatic function panel -     TSH -     Lipid panel  Hypertension, essential Assessment & Plan: On amlodipine 10mg  q day.  Blood pressure as outlined.  Follow pressures. Follow metabolic panel.  Some lower extremity swelling.  States has been present for a long time.  Elevate legs.  Have discussed compression hose.  No increased swelling.    Orders: -     Basic metabolic panel  B12 deficiency Assessment & Plan: Check B12 level.    Orders: -     CBC with Differential/Platelet -     Vitamin B12  Healthcare maintenance Assessment & Plan: Physical today 09/10/22.  Declines mammogram and colonoscopy.  Notify if agreeable for bone density.   Elevated alkaline phosphatase level Assessment & Plan: Recheck alkaline phos today.  Has had a persistent elevated alkaline phosphatase level.  (Mostly 150s with recent check 160s).  The remainder of her liver panel and GGT - wnl.  previous isoenzymes:  liver fraction percent 59, bone 33 and intestine 8%. (All wnl).  With these labs and GGT being normal, previously ordered a bone scan.  Recheck labs today.     Memory change Assessment & Plan: Previously receiving B12 injections.  She denies any  issues.  Follow. Notify me if agreeable to further evaluation.    Other orders -     amLODIPine Besylate; TAKE 1 TABLET BY MOUTH DAILY  Dispense: 90 tablet; Refill: 1 -     Rosuvastatin Calcium; Take 1 tablet (5 mg total) by mouth daily.  Dispense: 90 tablet; Refill: 1     Dale [place], MD

## 2022-09-10 NOTE — Assessment & Plan Note (Signed)
Physical today 09/10/22.  Declines mammogram and colonoscopy.  Notify if agreeable for bone density.

## 2022-09-12 ENCOUNTER — Encounter: Payer: Self-pay | Admitting: Internal Medicine

## 2022-09-12 NOTE — Assessment & Plan Note (Signed)
On amlodipine 10mg  q day.  Blood pressure as outlined.  Follow pressures. Follow metabolic panel.  Some lower extremity swelling.  States has been present for a long time.  Elevate legs.  Have discussed compression hose.  No increased swelling.

## 2022-09-12 NOTE — Assessment & Plan Note (Signed)
Recheck alkaline phos today.  Has had a persistent elevated alkaline phosphatase level.  (Mostly 150s with recent check 160s).  The remainder of her liver panel and GGT - wnl.  previous isoenzymes:  liver fraction percent 59, bone 33 and intestine 8%. (All wnl).  With these labs and GGT being normal, previously ordered a bone scan.  Recheck labs today.

## 2022-09-12 NOTE — Assessment & Plan Note (Signed)
Check B12 level. 

## 2022-09-12 NOTE — Assessment & Plan Note (Signed)
On crestor.  Follow lipid panel and liver function tests.   

## 2022-09-12 NOTE — Assessment & Plan Note (Signed)
Previously receiving B12 injections.  She denies any issues.  Follow. Notify me if agreeable to further evaluation.

## 2022-09-13 ENCOUNTER — Telehealth: Payer: Self-pay

## 2022-09-13 NOTE — Telephone Encounter (Signed)
-----   Message from Dale [place], MD sent at 09/12/2022 10:55 PM EDT ----- Notify - cholesterol levels look good.  Hgb, thyroid test and kidney function tests are wnl.  B12 level - up.  Confirm if she is receiving b12 injections any more.  If so, can stop and start oral B12 q day.  Alkaline phosphatase level slightly elevated.  Has been elevated previously.  Trending down. Given trending down, recheck liver panel in 4 weeks.

## 2022-09-24 ENCOUNTER — Other Ambulatory Visit: Payer: Self-pay

## 2022-09-24 DIAGNOSIS — R748 Abnormal levels of other serum enzymes: Secondary | ICD-10-CM

## 2022-10-04 ENCOUNTER — Other Ambulatory Visit (INDEPENDENT_AMBULATORY_CARE_PROVIDER_SITE_OTHER): Payer: Medicare Other

## 2022-10-04 DIAGNOSIS — R748 Abnormal levels of other serum enzymes: Secondary | ICD-10-CM | POA: Diagnosis not present

## 2022-10-04 NOTE — Progress Notes (Addendum)
[facility] - Clinic Note  10/11/2022     CHIEF COMPLAINT Patient presents for Retina Follow Up   HISTORY OF PRESENT ILLNESS: Debra Barr is a 77 y.o. female who presents to the clinic today for:   HPI     Retina Follow Up   Patient presents with  Other.  In both eyes.  Severity is moderate.  Duration of 4 months.  Since onset it is stable.  I, the attending physician,  performed the HPI with the patient and updated documentation appropriately.        Comments   Pt here for 4 mo ret f/u ERM OU. Pt states [facility] the same.       Last edited by Rennis Chris, MD on 10/11/2022  1:37 PM.    Pt states she has not noticed any change in vision  Referring physician: Dale [place], MD [address] [place],  [place] [address]  HISTORICAL INFORMATION:   Selected notes from the MEDICAL RECORD NUMBER Referred by Dr. Delaney Meigs for ERM OU / cataract clearance OU LEE:  Ocular Hx- PMH-    CURRENT MEDICATIONS: No current outpatient medications on file. (Ophthalmic Drugs)   No current facility-administered medications for this visit. (Ophthalmic Drugs)   Current Outpatient Medications (Other)  Medication Sig   amLODipine (NORVASC) 10 MG tablet TAKE 1 TABLET BY MOUTH DAILY   rosuvastatin (CRESTOR) 5 MG tablet Take 1 tablet (5 mg total) by mouth daily.   No current facility-administered medications for this visit. (Other)   REVIEW OF SYSTEMS: ROS   Positive for: Cardiovascular, Eyes Negative for: Constitutional, Gastrointestinal, Neurological, Skin, Genitourinary, Musculoskeletal, HENT, Endocrine, Respiratory, Psychiatric, Allergic/Imm, Heme/Lymph Last edited by Thompson Grayer, COT on 10/11/2022 12:53 PM.      ALLERGIES No Known Allergies  PAST MEDICAL HISTORY Past Medical History:  Diagnosis Date   Arthritis    Past Surgical History:  Procedure Laterality Date   NO PAST SURGERIES     FAMILY HISTORY Family History   Problem Relation Age of Onset   Arthritis Mother    Early death Father    Hyperlipidemia Father    Hypertension Brother    Kidney disease Brother    SOCIAL HISTORY Social History   Tobacco Use   Smoking status: Never   Smokeless tobacco: Never  Vaping Use   Vaping Use: Never used  Substance Use Topics   Alcohol use: Yes   Drug use: Never       OPHTHALMIC EXAM:  Base Eye Exam     Visual Acuity (Snellen - Linear)       Right Left   Dist [place] 20/30 -2 20/60 -2   Dist ph [place] NI 20/40 +2         Tonometry (Tonopen, 1:00 PM)       Right Left   Pressure 12 12         Pupils       Pupils Dark Light Shape React APD   Right PERRL 2 1 Round Brisk None   Left PERRL 2 1 Round Brisk None         Visual Fields (Counting fingers)       Left Right    Full Full         Extraocular Movement       Right Left    Full, Ortho Full, Ortho         Neuro/Psych     Oriented x3: Yes  Mood/Affect: Normal         Dilation     Both eyes: 1.0% Mydriacyl, 2.5% Phenylephrine @ 1:01 PM           Slit Lamp and Fundus Exam     Slit Lamp Exam       Right Left   Lids/Lashes Dermatochalasis - upper lid Dermatochalasis - upper lid, mild MGD   Conjunctiva/Sclera White and quiet White and quiet   Cornea Mild tear film debris, trace PEE, well healed cataract wound 2+ tear film debris, 2+ Punctate epithelial erosions   Anterior Chamber deep, clear, narrow temporal angle deep, clear, narrow temporal angle   Iris Round and dilated Round and dilated   Lens PC IOL in good position, trace posterior capsular opacification PC IOL in good position, trace posterior capsular opacification   Anterior Vitreous mild syneresis mild syneresis         Fundus Exam       Right Left   Disc mild Pallor, Sharp rim Pink and Sharp   C/D Ratio 0.65 0.6   Macula Flat, Blunted foveal reflex, ERM with mild striae, no heme Flat, Blunted foveal reflex, mild ERM with mild striae, no  heme   Vessels attenuated, mild tortuosity attenuated, mild tortuosity   Periphery Attached, no heme Attached, No heme           IMAGING AND PROCEDURES  Imaging and Procedures for 10/11/2022  OCT, Retina - OU - Both Eyes       Right Eye Quality was good. Central Foveal Thickness: 442. Progression has been stable. Findings include no IRF, no SRF, abnormal foveal contour, epiretinal membrane, macular pucker (ERM with stable loss of foveal contour, central thickening and early pucker -- stable).   Left Eye Quality was good. Central Foveal Thickness: 424. Progression has been stable. Findings include no IRF, no SRF, abnormal foveal contour, epiretinal membrane, macular pucker (Mild progression of ERM with blunted foveal contour, central thickening and early pucker).   Notes *Images captured and stored on drive  Diagnosis / Impression:  OD: ERM with stable loss of foveal contour, central thickening and early pucker -- stable OS: stable ERM with blunted foveal contour, central thickening and early pucker  Clinical management:  See below  Abbreviations: NFP - Normal foveal profile. CME - cystoid macular edema. PED - pigment epithelial detachment. IRF - intraretinal fluid. SRF - subretinal fluid. EZ - ellipsoid zone. ERM - epiretinal membrane. ORA - outer retinal atrophy. ORT - outer retinal tubulation. SRHM - subretinal hyper-reflective material. IRHM - intraretinal hyper-reflective material             ASSESSMENT/PLAN:    ICD-10-CM   1. 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 Essential hypertension  I10     3. Hypertensive retinopathy of both eyes  H35.033     4. Pseudophakia of both eyes  Z96.1       Epiretinal membrane, both eyes  - mild ERM OU w/ blunting of foveal contour, central retinal thickening and mild pucker OU - BCVA 20/30 OD, 20/40 OS - OCT stable OU; OD: ERM with stable loss of foveal contour, central thickening  and early pucker; OS: stable ERM with blunted foveal contour, central thickening and early pucker - mild metamorphopsia -- noted while looking at [facility] floors only - no indication for surgery at this time - monitor for now - f/u 6 mos, sooner prn -- DFE/OCT  2,3. Hypertensive retinopathy OU - discussed importance of tight BP control - monitor  4. Pseudophakia OU  - s/p CE/IOL OU (Dr. Delaney Meigs, December, 2023)  - IOLs in good position, doing well  - monitor  Ophthalmic Meds Ordered this visit:  No orders of the defined types were placed in this encounter.    Return in about 6 months (around 04/12/2023) for f/u ERM OU, DFE, OCT.  There are no Patient Instructions on file for this visit.   Explained the diagnoses, plan, and follow up with the patient and they expressed understanding.  Patient expressed understanding of the importance of proper follow up care.   This document serves as a record of services personally performed by Karie Chimera, MD, PhD. It was created on their behalf by De Blanch, an ophthalmic technician. The creation of this record is the provider's dictation and/or activities during the visit.    Electronically signed by: De Blanch, OA, 10/11/22  1:41 PM  This document serves as a record of services personally performed by Karie Chimera, MD, PhD. It was created on their behalf by Glee Arvin. Manson Passey, OA an ophthalmic technician. The creation of this record is the provider's dictation and/or activities during the visit.    Electronically signed by: Glee Arvin. Manson Passey, [place] 06.17.2024 1:41 PM  Karie Chimera, M.D., Ph.D. Diseases & Surgery of the Retina and Vitreous [facility]  I have reviewed the above documentation for accuracy and completeness, and I agree with the above. Karie Chimera, M.D., Ph.D. 10/11/22 1:41 PM   Abbreviations: M myopia (nearsighted); A astigmatism; H hyperopia (farsighted); P presbyopia; Mrx  spectacle prescription;  CTL contact lenses; OD right eye; OS left eye; OU both eyes  XT exotropia; ET esotropia; PEK punctate epithelial keratitis; PEE punctate epithelial erosions; DES dry eye syndrome; MGD meibomian gland dysfunction; ATs artificial tears; PFAT's preservative free artificial tears; NSC nuclear sclerotic cataract; PSC posterior subcapsular cataract; ERM epi-retinal membrane; PVD posterior vitreous detachment; RD retinal detachment; DM diabetes mellitus; DR diabetic retinopathy; NPDR non-proliferative diabetic retinopathy; PDR proliferative diabetic retinopathy; CSME clinically significant macular edema; DME diabetic macular edema; dbh dot blot hemorrhages; CWS cotton wool spot; POAG primary open angle glaucoma; C/D cup-to-disc ratio; HVF humphrey visual field; GVF goldmann visual field; OCT optical coherence tomography; IOP intraocular pressure; BRVO Branch retinal vein occlusion; CRVO central retinal vein occlusion; CRAO central retinal artery occlusion; BRAO branch retinal artery occlusion; RT retinal tear; SB scleral buckle; PPV pars plana vitrectomy; VH Vitreous hemorrhage; PRP panretinal laser photocoagulation; IVK intravitreal kenalog; VMT vitreomacular traction; MH Macular hole;  NVD neovascularization of the disc; NVE neovascularization elsewhere; AREDS age related eye disease study; ARMD age related macular degeneration; POAG primary open angle glaucoma; EBMD epithelial/anterior basement membrane dystrophy; ACIOL anterior chamber intraocular lens; IOL intraocular lens; PCIOL posterior chamber intraocular lens; Phaco/IOL phacoemulsification with intraocular lens placement; PRK photorefractive keratectomy; LASIK laser assisted in situ keratomileusis; HTN hypertension; DM diabetes mellitus; COPD chronic obstructive pulmonary disease

## 2022-10-05 LAB — HEPATIC FUNCTION PANEL
ALT: 10 U/L (ref 0–35)
AST: 15 U/L (ref 0–37)
Albumin: 4 g/dL (ref 3.5–5.2)
Alkaline Phosphatase: 103 U/L (ref 39–117)
Bilirubin, Direct: 0.1 mg/dL (ref 0.0–0.3)
Total Bilirubin: 0.4 mg/dL (ref 0.2–1.2)
Total Protein: 7.1 g/dL (ref 6.0–8.3)

## 2022-10-11 ENCOUNTER — Ambulatory Visit (INDEPENDENT_AMBULATORY_CARE_PROVIDER_SITE_OTHER): Payer: Medicare Other | Admitting: Ophthalmology

## 2022-10-11 ENCOUNTER — Encounter (INDEPENDENT_AMBULATORY_CARE_PROVIDER_SITE_OTHER): Payer: Self-pay | Admitting: Ophthalmology

## 2022-10-11 DIAGNOSIS — H35033 Hypertensive retinopathy, bilateral: Secondary | ICD-10-CM

## 2022-10-11 DIAGNOSIS — H35373 Puckering of macula, bilateral: Secondary | ICD-10-CM

## 2022-10-11 DIAGNOSIS — I10 Essential (primary) hypertension: Secondary | ICD-10-CM

## 2022-10-11 DIAGNOSIS — Z961 Presence of intraocular lens: Secondary | ICD-10-CM | POA: Diagnosis not present

## 2022-10-11 DIAGNOSIS — H25813 Combined forms of age-related cataract, bilateral: Secondary | ICD-10-CM

## 2023-03-14 ENCOUNTER — Encounter: Payer: Self-pay | Admitting: Internal Medicine

## 2023-03-14 ENCOUNTER — Telehealth: Payer: Self-pay

## 2023-03-14 ENCOUNTER — Ambulatory Visit (INDEPENDENT_AMBULATORY_CARE_PROVIDER_SITE_OTHER): Payer: Medicare Other | Admitting: Internal Medicine

## 2023-03-14 VITALS — BP 130/70 | HR 83 | Temp 97.8°F | Ht 64.5 in | Wt 160.2 lb

## 2023-03-14 DIAGNOSIS — I10 Essential (primary) hypertension: Secondary | ICD-10-CM | POA: Diagnosis not present

## 2023-03-14 DIAGNOSIS — E7800 Pure hypercholesterolemia, unspecified: Secondary | ICD-10-CM

## 2023-03-14 DIAGNOSIS — R413 Other amnesia: Secondary | ICD-10-CM

## 2023-03-14 DIAGNOSIS — R748 Abnormal levels of other serum enzymes: Secondary | ICD-10-CM

## 2023-03-14 MED ORDER — AMLODIPINE BESYLATE 10 MG PO TABS: ORAL_TABLET | ORAL | 1 refills | Status: DC

## 2023-03-14 MED ORDER — ROSUVASTATIN CALCIUM 5 MG PO TABS: 5.0000 mg | ORAL_TABLET | Freq: Every day | ORAL | 1 refills | Status: DC

## 2023-03-14 NOTE — Telephone Encounter (Signed)
Patient states at check-out that she needs to know the name of the cough medication Dr. Dale [place] told her to use.

## 2023-03-14 NOTE — Telephone Encounter (Signed)
Robitussin DM - if cough and congestion.

## 2023-03-14 NOTE — Telephone Encounter (Signed)
Pt.notified

## 2023-03-14 NOTE — Progress Notes (Signed)
Subjective:    Patient ID: Debra Barr, female    DOB: [birth year], 77 y.o.   MRN: [id number]  Patient here for  Chief Complaint  Patient presents with   Medical Management of Chronic Issues    HPI Here for a scheduled follow up - here to follow up regarding hypercholesterolemia and hypertension.  She reports she is doing well.  Did have increased cough/congestion a couple of weeks ago.  Doing much better now.  Cough is better. No chest congestion or sob. No abdominal pain.  Bowels moving. Memory - stable.    Past Medical History:  Diagnosis Date   Arthritis    Past Surgical History:  Procedure Laterality Date   NO PAST SURGERIES     Family History  Problem Relation Age of Onset   Arthritis Mother    Early death Father    Hyperlipidemia Father    Hypertension Brother    Kidney disease Brother    Social History   Socioeconomic History   Marital status: Married    Spouse name: Not on file   Number of children: Not on file   Years of education: Not on file   Highest education level: Not on file  Occupational History   Not on file  Tobacco Use   Smoking status: Never   Smokeless tobacco: Never  Vaping Use   Vaping status: Never Used  Substance and Sexual Activity   Alcohol use: Yes   Drug use: Never   Sexual activity: Not Currently  Other Topics Concern   Not on file  Social History Narrative   Not on file   Social Determinants of Health   Financial Resource Strain: Low Risk  (07/05/2022)   Overall Financial Resource Strain (CARDIA)    Difficulty of Paying Living Expenses: Not hard at all  Food Insecurity: No Food Insecurity (07/05/2022)   Hunger Vital Sign    Worried About Running Out of Food in the Last Year: Never true    Ran Out of Food in the Last Year: Never true  Transportation Needs: No Transportation Needs (07/05/2022)   PRAPARE - Administrator, Civil Service (Medical): No    Lack of Transportation (Non-Medical): No  Physical  Activity: Not on file  Stress: No Stress Concern Present (07/05/2022)   [employer] of Occupational Health - Occupational Stress Questionnaire    Feeling of Stress : Not at all  Social Connections: Unknown (07/05/2022)   Social Connection and Isolation Panel [NHANES]    Frequency of Communication with Friends and Family: More than three times a week    Frequency of Social Gatherings with Friends and Family: More than three times a week    Attends Religious Services: Not on Marketing executive or Organizations: Not on file    Attends Banker Meetings: Not on file    Marital Status: Married     Review of Systems  Constitutional:  Negative for appetite change and unexpected weight change.  HENT:  Negative for congestion and sinus pressure.   Respiratory:  Negative for cough, chest tightness and shortness of breath.   Cardiovascular:  Negative for chest pain and palpitations.  Gastrointestinal:  Negative for abdominal pain, diarrhea, nausea and vomiting.  Genitourinary:  Negative for difficulty urinating and dysuria.  Musculoskeletal:  Negative for joint swelling and myalgias.  Skin:  Negative for color change and rash.  Neurological:  Negative for dizziness and headaches.  Psychiatric/Behavioral:  Negative  for agitation and dysphoric mood.        Objective:     BP 130/70   Pulse 83   Temp 97.8 F (36.6 C) (Oral)   Ht 5' 4.5" (1.638 m)   Wt 160 lb 3.2 oz (72.7 kg)   SpO2 96%   BMI 27.07 kg/m  Wt Readings from Last 3 Encounters:  03/14/23 160 lb 3.2 oz (72.7 kg)  09/10/22 159 lb (72.1 kg)  07/05/22 155 lb (70.3 kg)    Physical Exam Vitals reviewed.  Constitutional:      General: She is not in acute distress.    Appearance: Normal appearance.  HENT:     Head: Normocephalic and atraumatic.     Right Ear: External ear normal.     Left Ear: External ear normal.  Eyes:     General: No scleral icterus.       Right eye: No discharge.         Left eye: No discharge.     Conjunctiva/sclera: Conjunctivae normal.  Neck:     Thyroid: No thyromegaly.  Cardiovascular:     Rate and Rhythm: Normal rate and regular rhythm.  Pulmonary:     Effort: No respiratory distress.     Breath sounds: Normal breath sounds. No wheezing.  Abdominal:     General: Bowel sounds are normal.     Palpations: Abdomen is soft.     Tenderness: There is no abdominal tenderness.  Musculoskeletal:        General: No swelling or tenderness.     Cervical back: Neck supple. No tenderness.  Lymphadenopathy:     Cervical: No cervical adenopathy.  Skin:    Findings: No erythema or rash.  Neurological:     Mental Status: She is alert.  Psychiatric:        Mood and Affect: Mood normal.        Behavior: Behavior normal.      Outpatient Encounter Medications as of 03/14/2023  Medication Sig   amLODipine (NORVASC) 10 MG tablet TAKE 1 TABLET BY MOUTH DAILY   rosuvastatin (CRESTOR) 5 MG tablet Take 1 tablet (5 mg total) by mouth daily.   [DISCONTINUED] amLODipine (NORVASC) 10 MG tablet TAKE 1 TABLET BY MOUTH DAILY   [DISCONTINUED] rosuvastatin (CRESTOR) 5 MG tablet Take 1 tablet (5 mg total) by mouth daily.   No facility-administered encounter medications on file as of 03/14/2023.     Lab Results  Component Value Date   WBC 5.4 09/10/2022   HGB 14.1 09/10/2022   HCT 42.7 09/10/2022   PLT 378.0 09/10/2022   GLUCOSE 77 03/14/2023   CHOL 144 03/14/2023   TRIG 87.0 03/14/2023   HDL 53.90 03/14/2023   LDLCALC 73 03/14/2023   ALT 11 03/14/2023   AST 15 03/14/2023   NA 142 03/14/2023   K 4.2 03/14/2023   CL 103 03/14/2023   CREATININE 0.79 03/14/2023   BUN 20 03/14/2023   CO2 29 03/14/2023   TSH 0.81 09/10/2022    DG Finger Index Right     Assessment & Plan:  Hypercholesterolemia Assessment & Plan: On crestor.  Follow lipid panel and liver function tests.    Orders: -     Lipid panel -     Hepatic function panel -     Basic metabolic  panel  Memory change Assessment & Plan: Previously receiving B12 injections.  She denies any issues.  Follow. Notify me if agreeable to further evaluation.    Hypertension, essential  Assessment & Plan: On amlodipine 10mg  q day.  Blood pressure as outlined.  Follow pressures. Follow metabolic panel.  Some lower extremity swelling.  States has been present for a long time.  Elevate legs.  Have discussed compression hose.     Elevated alkaline phosphatase level Assessment & Plan: Alkaline phos - last check - wnl   Other orders -     amLODIPine Besylate; TAKE 1 TABLET BY MOUTH DAILY  Dispense: 90 tablet; Refill: 1 -     Rosuvastatin Calcium; Take 1 tablet (5 mg total) by mouth daily.  Dispense: 90 tablet; Refill: 1     Dale [place], MD

## 2023-03-15 LAB — HEPATIC FUNCTION PANEL
ALT: 11 U/L (ref 0–35)
AST: 15 U/L (ref 0–37)
Albumin: 4.1 g/dL (ref 3.5–5.2)
Alkaline Phosphatase: 116 U/L (ref 39–117)
Bilirubin, Direct: 0.1 mg/dL (ref 0.0–0.3)
Total Bilirubin: 0.4 mg/dL (ref 0.2–1.2)
Total Protein: 7.6 g/dL (ref 6.0–8.3)

## 2023-03-15 LAB — LIPID PANEL
Cholesterol: 144 mg/dL (ref 0–200)
HDL: 53.9 mg/dL (ref >39.00)
LDL Cholesterol: 73 mg/dL (ref 0–99)
NonHDL: 90.14
Total CHOL/HDL Ratio: 3
Triglycerides: 87 mg/dL (ref 0.0–149.0)
VLDL: 17.4 mg/dL (ref 0.0–40.0)

## 2023-03-15 LAB — BASIC METABOLIC PANEL
BUN: 20 mg/dL (ref 6–23)
CO2: 29 meq/L (ref 19–32)
Calcium: 9.4 mg/dL (ref 8.4–10.5)
Chloride: 103 meq/L (ref 96–112)
Creatinine, Ser: 0.79 mg/dL (ref 0.40–1.20)
GFR: 71.54 mL/min (ref >60.00)
Glucose, Bld: 77 mg/dL (ref 70–99)
Potassium: 4.2 meq/L (ref 3.5–5.1)
Sodium: 142 meq/L (ref 135–145)

## 2023-03-19 ENCOUNTER — Encounter: Payer: Self-pay | Admitting: Internal Medicine

## 2023-03-19 NOTE — Assessment & Plan Note (Signed)
Previously receiving B12 injections.  She denies any issues.  Follow. Notify me if agreeable to further evaluation.

## 2023-03-19 NOTE — Assessment & Plan Note (Addendum)
On amlodipine 10mg  q day.  Blood pressure as outlined.  Follow pressures. Follow metabolic panel.  Some lower extremity swelling.  States has been present for a long time.  Elevate legs.  Have discussed compression hose.

## 2023-03-19 NOTE — Assessment & Plan Note (Signed)
Alkaline phos - last check - wnl

## 2023-03-19 NOTE — Assessment & Plan Note (Signed)
On crestor.  Follow lipid panel and liver function tests.   

## 2023-04-05 NOTE — Progress Notes (Signed)
[facility] - Clinic Note  04/11/2023     CHIEF COMPLAINT Patient presents for Retina Follow Up   HISTORY OF PRESENT ILLNESS: Debra Barr is a 77 y.o. female who presents to the clinic today for:   HPI     Retina Follow Up   In both eyes.  This started 6 months ago.  Duration of 6 months.  Since onset it is stable.  I, the attending physician,  performed the HPI with the patient and updated documentation appropriately.        Comments   6 month retina follow up pt is reporting no vision changes noticed she denies any flashes or floaters       Last edited by Rennis Chris, MD on 04/11/2023 11:46 PM.    Pt states she doesn't feel like her vision is the same, but it's not extraordinarily different either, she is not seeing any distortion with straight lines, she states her eyes are dry, but she is not using any drops  Referring physician: Tyrone Schimke, MD [address] [place],  [place] [address]  HISTORICAL INFORMATION:   Selected notes from the MEDICAL RECORD NUMBER Referred by Dr. Delaney Meigs for ERM OU / cataract clearance OU LEE:  Ocular Hx- PMH-    CURRENT MEDICATIONS: No current outpatient medications on file. (Ophthalmic Drugs)   No current facility-administered medications for this visit. (Ophthalmic Drugs)   Current Outpatient Medications (Other)  Medication Sig   amLODipine (NORVASC) 10 MG tablet TAKE 1 TABLET BY MOUTH DAILY   rosuvastatin (CRESTOR) 5 MG tablet Take 1 tablet (5 mg total) by mouth daily.   No current facility-administered medications for this visit. (Other)   REVIEW OF SYSTEMS: ROS   Positive for: Cardiovascular, Eyes Negative for: Constitutional, Gastrointestinal, Neurological, Skin, Genitourinary, Musculoskeletal, HENT, Endocrine, Respiratory, Psychiatric, Allergic/Imm, Heme/Lymph Last edited by Etheleen Mayhew, COT on 04/11/2023 12:50 PM.       ALLERGIES No Known Allergies  PAST  MEDICAL HISTORY Past Medical History:  Diagnosis Date   Arthritis    Past Surgical History:  Procedure Laterality Date   NO PAST SURGERIES     FAMILY HISTORY Family History  Problem Relation Age of Onset   Arthritis Mother    Early death Father    Hyperlipidemia Father    Hypertension Brother    Kidney disease Brother    SOCIAL HISTORY Social History   Tobacco Use   Smoking status: Never   Smokeless tobacco: Never  Vaping Use   Vaping status: Never Used  Substance Use Topics   Alcohol use: Yes   Drug use: Never       OPHTHALMIC EXAM:  Base Eye Exam     Visual Acuity (Snellen - Linear)       Right Left   Dist [place] 20/40 -2 20/50 -2   Dist ph [place] NI NI         Tonometry (Tonopen, 12:56 PM)       Right Left   Pressure 11 13         Pupils       Pupils Dark Light Shape React APD   Right PERRL 2 1 Round Brisk None   Left PERRL 2 1 Round Brisk None         Visual Fields       Left Right    Full Full         Extraocular Movement  Right Left    Full, Ortho Full, Ortho         Neuro/Psych     Oriented x3: Yes   Mood/Affect: Normal         Dilation     Both eyes: 2.5% Phenylephrine @ 12:56 PM           Slit Lamp and Fundus Exam     Slit Lamp Exam       Right Left   Lids/Lashes Dermatochalasis - upper lid Dermatochalasis - upper lid, mild MGD   Conjunctiva/Sclera White and quiet White and quiet   Cornea Mild EBMD, tear film debris, well healed cataract wound Mild EBMD, tear film debris, well healed cataract wound   Anterior Chamber deep, clear, narrow temporal angle deep, clear, narrow temporal angle   Iris Round and dilated Round and dilated   Lens PC IOL in good position, trace posterior capsular opacification PC IOL in good position, trace posterior capsular opacification   Anterior Vitreous mild syneresis mild syneresis         Fundus Exam       Right Left   Disc mild Pallor, Sharp rim Pink and Sharp   C/D  Ratio 0.7 0.6   Macula Flat, Blunted foveal reflex, ERM with mild striae, RPE mottling, no heme Flat, Blunted foveal reflex, ERM with mild striae, no heme   Vessels attenuated, mild tortuosity attenuated, mild tortuosity   Periphery Attached, no heme Attached, No heme           IMAGING AND PROCEDURES  Imaging and Procedures for 04/11/2023  OCT, Retina - OU - Both Eyes       Right Eye Quality was good. Central Foveal Thickness: 434. Progression has been stable. Findings include no IRF, no SRF, abnormal foveal contour, epiretinal membrane, macular pucker (ERM with stable loss of foveal contour, central thickening and early pucker -- stable).   Left Eye Quality was good. Central Foveal Thickness: 411. Progression has been stable. Findings include no IRF, no SRF, abnormal foveal contour, epiretinal membrane, macular pucker (ERM with blunted foveal contour, central thickening and early pucker -- stable from prior).   Notes *Images captured and stored on drive  Diagnosis / Impression:  OD: ERM with loss of foveal contour, central thickening and early pucker -- stable OS: ERM with blunted foveal contour, central thickening and early pucker -- stable from prior  Clinical management:  See below  Abbreviations: NFP - Normal foveal profile. CME - cystoid macular edema. PED - pigment epithelial detachment. IRF - intraretinal fluid. SRF - subretinal fluid. EZ - ellipsoid zone. ERM - epiretinal membrane. ORA - outer retinal atrophy. ORT - outer retinal tubulation. SRHM - subretinal hyper-reflective material. IRHM - intraretinal hyper-reflective material            ASSESSMENT/PLAN:    ICD-10-CM   1. 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 Essential hypertension  I10     3. Hypertensive retinopathy of both eyes  H35.033     4. Pseudophakia of both eyes  Z96.1     5. Dry eyes  H04.123      Epiretinal membrane, both eyes  - mild ERM OU  w/ blunting of foveal contour, central retinal thickening and mild pucker OU -- stable - BCVA OD 20/40 from 20/30; OS 20/50 from 20/40 -- ?dry eyes - OCT stable OU; OD: ERM with loss of foveal contour, central thickening and early  pucker -- stable; OS: ERM with blunted foveal contour, central thickening and early pucker -- stable from prior - mild metamorphopsia -- noted while looking at [facility] floors only - pt not interested in surgery at this time - monitor for now - f/u 6 mos, sooner prn -- DFE/OCT  2,3. Hypertensive retinopathy OU - discussed importance of tight BP control - monitor  4. Pseudophakia OU  - s/p CE/IOL OU (Dr. Delaney Meigs, December, 2023)  - IOLs in good position, doing well  - monitor  5. Dry eyes OU - recommend artificial tears and lubricating ointment as needed   Ophthalmic Meds Ordered this visit:  No orders of the defined types were placed in this encounter.    Return in about 6 months (around 10/10/2023) for f/u ERM OU, DFE, OCT.  There are no Patient Instructions on file for this visit.   Explained the diagnoses, plan, and follow up with the patient and they expressed understanding.  Patient expressed understanding of the importance of proper follow up care.   This document serves as a record of services personally performed by Karie Chimera, MD, PhD. It was created on their behalf by Glee Arvin. Manson Passey, OA an ophthalmic technician. The creation of this record is the provider's dictation and/or activities during the visit.    Electronically signed by: Glee Arvin. Manson Passey, OA 04/11/23 11:50 PM  Karie Chimera, M.D., Ph.D. Diseases & Surgery of the Retina and Vitreous [facility]  I have reviewed the above documentation for accuracy and completeness, and I agree with the above. Karie Chimera, M.D., Ph.D. 04/11/23 11:50 PM  Abbreviations: M myopia (nearsighted); A astigmatism; H hyperopia (farsighted); P presbyopia; Mrx spectacle  prescription;  CTL contact lenses; OD right eye; OS left eye; OU both eyes  XT exotropia; ET esotropia; PEK punctate epithelial keratitis; PEE punctate epithelial erosions; DES dry eye syndrome; MGD meibomian gland dysfunction; ATs artificial tears; PFAT's preservative free artificial tears; NSC nuclear sclerotic cataract; PSC posterior subcapsular cataract; ERM epi-retinal membrane; PVD posterior vitreous detachment; RD retinal detachment; DM diabetes mellitus; DR diabetic retinopathy; NPDR non-proliferative diabetic retinopathy; PDR proliferative diabetic retinopathy; CSME clinically significant macular edema; DME diabetic macular edema; dbh dot blot hemorrhages; CWS cotton wool spot; POAG primary open angle glaucoma; C/D cup-to-disc ratio; HVF humphrey visual field; GVF goldmann visual field; OCT optical coherence tomography; IOP intraocular pressure; BRVO Branch retinal vein occlusion; CRVO central retinal vein occlusion; CRAO central retinal artery occlusion; BRAO branch retinal artery occlusion; RT retinal tear; SB scleral buckle; PPV pars plana vitrectomy; VH Vitreous hemorrhage; PRP panretinal laser photocoagulation; IVK intravitreal kenalog; VMT vitreomacular traction; MH Macular hole;  NVD neovascularization of the disc; NVE neovascularization elsewhere; AREDS age related eye disease study; ARMD age related macular degeneration; POAG primary open angle glaucoma; EBMD epithelial/anterior basement membrane dystrophy; ACIOL anterior chamber intraocular lens; IOL intraocular lens; PCIOL posterior chamber intraocular lens; Phaco/IOL phacoemulsification with intraocular lens placement; PRK photorefractive keratectomy; LASIK laser assisted in situ keratomileusis; HTN hypertension; DM diabetes mellitus; COPD chronic obstructive pulmonary disease

## 2023-04-11 ENCOUNTER — Ambulatory Visit (INDEPENDENT_AMBULATORY_CARE_PROVIDER_SITE_OTHER): Payer: Medicare Other | Admitting: Ophthalmology

## 2023-04-11 ENCOUNTER — Encounter (INDEPENDENT_AMBULATORY_CARE_PROVIDER_SITE_OTHER): Payer: Self-pay | Admitting: Ophthalmology

## 2023-04-11 DIAGNOSIS — I10 Essential (primary) hypertension: Secondary | ICD-10-CM | POA: Diagnosis not present

## 2023-04-11 DIAGNOSIS — H35373 Puckering of macula, bilateral: Secondary | ICD-10-CM

## 2023-04-11 DIAGNOSIS — Z961 Presence of intraocular lens: Secondary | ICD-10-CM

## 2023-04-11 DIAGNOSIS — H04123 Dry eye syndrome of bilateral lacrimal glands: Secondary | ICD-10-CM

## 2023-04-11 DIAGNOSIS — H35033 Hypertensive retinopathy, bilateral: Secondary | ICD-10-CM

## 2023-07-06 ENCOUNTER — Ambulatory Visit: Payer: Medicare Other | Admitting: *Deleted

## 2023-07-06 VITALS — Ht 64.5 in | Wt 160.0 lb

## 2023-07-06 DIAGNOSIS — Z1159 Encounter for screening for other viral diseases: Secondary | ICD-10-CM | POA: Diagnosis not present

## 2023-07-06 DIAGNOSIS — Z0000 Encounter for general adult medical examination without abnormal findings: Secondary | ICD-10-CM

## 2023-07-06 NOTE — Patient Instructions (Signed)
 Ms. Debra Barr , Thank you for taking time to come for your Medicare Wellness Visit. I appreciate your ongoing commitment to your health goals. Please review the following plan we discussed and let me know if I can assist you in the future.   Referrals/Orders/Follow-Ups/Clinician Recommendations: Remember to update your tetanus and shingles vaccines. Your first shingles vaccine was 10/04/22 and records updated.  This is a list of the screening recommended for you and due dates:  Health Maintenance  Topic Date Due   Hepatitis C Screening  Never done   DTaP/Tdap/Td vaccine (2 - Td or Tdap) 09/07/2018   Zoster (Shingles) Vaccine (2 of 2) 11/29/2022   COVID-19 Vaccine (3 - 2024-25 season) 12/26/2022   Flu Shot  07/25/2023*   Medicare Annual Wellness Visit  07/05/2024   Pneumonia Vaccine  Completed   HPV Vaccine  Aged Out   DEXA scan (bone density measurement)  Discontinued  *Topic was postponed. The date shown is not the original due date.    Advanced directives: (Copy Requested) Please bring a copy of your health care power of attorney and living will to the office to be added to your chart at your convenience. You can mail to [facility] [address] [place], [place] [address] or email to [email or web address]  Next Medicare Annual Wellness Visit scheduled for next year: Yes 07/10/24 @ 9:30

## 2023-07-06 NOTE — Progress Notes (Signed)
 Subjective:   Debra Barr is a 78 y.o. who presents for a Medicare Wellness preventive visit.  Visit Complete: Virtual I connected with  Wiliam Ke on 07/06/23 by a audio enabled telemedicine application and verified that I am speaking with the correct person using two identifiers.  Patient Location: Home  Provider Location: Office/Clinic  I discussed the limitations of evaluation and management by telemedicine. The patient expressed understanding and agreed to proceed.  Vital Signs: Because this visit was a virtual/telehealth visit, some criteria may be missing or patient reported. Any vitals not documented were not able to be obtained and vitals that have been documented are patient reported.  VideoDeclined- This patient declined Librarian, academic. Therefore the visit was completed with audio only.  AWV Questionnaire: Yes: Patient Medicare AWV questionnaire was completed by the patient on 07/05/23; I have confirmed that all information answered by patient is correct and no changes since this date.  Cardiac Risk Factors include: advanced age (>61men, >46 women);dyslipidemia;hypertension     Objective:    Today's Vitals   07/06/23 0931  Weight: 160 lb (72.6 kg)  Height: 5' 4.5" (1.638 m)   Body mass index is 27.04 kg/m.     07/06/2023    9:41 AM 07/05/2022   10:56 AM 05/28/2021   10:41 AM 05/27/2020   10:40 AM 05/25/2019   10:42 AM  Advanced Directives  Does Patient Have a Medical Advance Directive? Yes Yes Yes No Yes  Type of Estate agent of [place];Living will Healthcare Power of [place];Living will Healthcare Power of [place];Living will  Living will  Does patient want to make changes to medical advance directive?  No - Patient declined No - Patient declined  No - Patient declined  Copy of Healthcare Power of Attorney in Chart? No - copy requested No - copy requested No - copy requested    Would patient like  information on creating a medical advance directive?    No - Patient declined     Current Medications (verified) Outpatient Encounter Medications as of 07/06/2023  Medication Sig   amLODipine (NORVASC) 10 MG tablet TAKE 1 TABLET BY MOUTH DAILY   Ascorbic Acid (VITAMIN C GUMMIES PO) Take by mouth. Take two by mouth daily   Biotin 1000 MCG tablet Take 1,000 mcg by mouth daily.   rosuvastatin (CRESTOR) 5 MG tablet Take 1 tablet (5 mg total) by mouth daily.   No facility-administered encounter medications on file as of 07/06/2023.    Allergies (verified) Patient has no known allergies.   History: Past Medical History:  Diagnosis Date   Arthritis    Past Surgical History:  Procedure Laterality Date   NO PAST SURGERIES     Family History  Problem Relation Age of Onset   Arthritis Mother    Early death Father    Hyperlipidemia Father    Hypertension Brother    Kidney disease Brother    Social History   Socioeconomic History   Marital status: Married    Spouse name: Not on file   Number of children: Not on file   Years of education: Not on file   Highest education level: Associate degree: occupational, Scientist, product/process development, or vocational program  Occupational History   Not on file  Tobacco Use   Smoking status: Never   Smokeless tobacco: Never  Vaping Use   Vaping status: Never Used  Substance and Sexual Activity   Alcohol use: Yes   Drug use: Never  Sexual activity: Not Currently  Other Topics Concern   Not on file  Social History Narrative   Married   Social Drivers of Health   Financial Resource Strain: Low Risk  (07/05/2023)   Overall Financial Resource Strain (CARDIA)    Difficulty of Paying Living Expenses: Not hard at all  Food Insecurity: No Food Insecurity (07/05/2023)   Hunger Vital Sign    Worried About Running Out of Food in the Last Year: Never true    Ran Out of Food in the Last Year: Never true  Transportation Needs: No Transportation Needs (07/05/2023)    PRAPARE - Administrator, Civil Service (Medical): No    Lack of Transportation (Non-Medical): No  Physical Activity: Insufficiently Active (07/05/2023)   Exercise Vital Sign    Days of Exercise per Week: 2 days    Minutes of Exercise per Session: 30 min  Stress: No Stress Concern Present (07/05/2023)   [employer] of Occupational Health - Occupational Stress Questionnaire    Feeling of Stress : Only a little  Social Connections: Socially Integrated (07/05/2023)   Social Connection and Isolation Panel [NHANES]    Frequency of Communication with Friends and Family: Three times a week    Frequency of Social Gatherings with Friends and Family: Three times a week    Attends Religious Services: More than 4 times per year    Active Member of Clubs or Organizations: Yes    Attends Engineer, structural: More than 4 times per year    Marital Status: Married    Tobacco Counseling Counseling given: Not Answered    Clinical Intake:  Pre-visit preparation completed: Yes  Pain : No/denies pain     BMI - recorded: 27.04 Nutritional Status: BMI 25 -29 Overweight Nutritional Risks: None Diabetes: No  How often do you need to have someone help you when you read instructions, pamphlets, or other written materials from your doctor or pharmacy?: 1 - Never  Interpreter Needed?: No  Information entered by :: R. Makaylyn Sinyard LPN   Activities of Daily Living     07/05/2023    9:19 PM  In your present state of health, do you have any difficulty performing the following activities:  Hearing? 0  Vision? 0  Difficulty concentrating or making decisions? 1  Walking or climbing stairs? 0  Dressing or bathing? 0  Doing errands, shopping? 0  Preparing Food and eating ? N  Using the Toilet? N  In the past six months, have you accidently leaked urine? Y  Do you have problems with loss of bowel control? N  Managing your Medications? N  Managing your Finances? N  Housekeeping or  managing your Housekeeping? N    Patient Care Team: Dale Templeton, MD as PCP - General (Internal Medicine)  Indicate any recent Medical Services you may have received from other than [facility] providers in the past year (date may be approximate).     Assessment:   This is a routine wellness examination for Dionisia.  Hearing/Vision screen Hearing Screening - Comments:: No issues Vision Screening - Comments:: readers   Goals Addressed             This Visit's Progress    Patient Stated       Wants to garden more, ride her new bike more       Depression Screen     07/06/2023    9:36 AM 07/05/2022   11:10 AM 05/25/2022   12:15 PM 05/28/2021  10:44 AM 01/28/2021   11:22 AM 11/27/2020   11:34 AM 05/27/2020   10:38 AM  PHQ 2/9 Scores  PHQ - 2 Score 0 0 0 0 0 0 0  PHQ- 9 Score 0          Fall Risk     07/05/2023    9:19 PM 03/14/2023    1:48 PM 07/05/2022   11:00 AM 05/25/2022   12:15 PM 05/28/2021   10:44 AM  Fall Risk   Falls in the past year? 0 0 0 0 0  Number falls in past yr: 0 0 0 0 0  Injury with Fall? 0 0 0 0   Risk for fall due to : No Fall Risks No Fall Risks  No Fall Risks   Follow up Falls prevention discussed;Falls evaluation completed Falls evaluation completed Falls evaluation completed;Falls prevention discussed Falls evaluation completed Falls evaluation completed    MEDICARE RISK AT HOME:  Medicare Risk at Home Any stairs in or around the home?: (Patient-Rptd) Yes If so, are there any without handrails?: (Patient-Rptd) No Home free of loose throw rugs in walkways, pet beds, electrical cords, etc?: (Patient-Rptd) Yes Adequate lighting in your home to reduce risk of falls?: (Patient-Rptd) Yes Life alert?: (Patient-Rptd) No Use of a cane, walker or w/c?: (Patient-Rptd) No Grab bars in the bathroom?: (Patient-Rptd) Yes Shower chair or bench in shower?: (Patient-Rptd) Yes Elevated toilet seat or a handicapped toilet?: (Patient-Rptd) Yes  TIMED UP AND  GO:  Was the test performed?  No  Cognitive Function: 6CIT completed        07/06/2023    9:42 AM 07/05/2022   11:10 AM 05/28/2021   11:06 AM 05/27/2020   10:42 AM 05/25/2019   10:51 AM  6CIT Screen  What Year? 0 points 0 points 0 points  0 points  What month? 0 points 0 points 0 points  0 points  What time? 0 points 0 points 0 points  0 points  Count back from 20 0 points 0 points 0 points  0 points  Months in reverse 0 points 0 points 0 points 0 points 0 points  Repeat phrase 2 points 0 points 0 points  0 points  Total Score 2 points 0 points 0 points  0 points    Immunizations Immunization History  Administered Date(s) Administered   Fluad Quad(high Dose 65+) 04/02/2021   PFIZER(Purple Top)SARS-COV-2 Vaccination 06/18/2019, 07/09/2019   Pneumococcal Conjugate-13 12/07/2018   Pneumococcal Polysaccharide-23 06/01/2021   Tdap 09/06/2008    Screening Tests Health Maintenance  Topic Date Due   Hepatitis C Screening  Never done   Zoster Vaccines- Shingrix (1 of 2) Never done   DTaP/Tdap/Td (2 - Td or Tdap) 09/07/2018   COVID-19 Vaccine (3 - 2024-25 season) 12/26/2022   Medicare Annual Wellness (AWV)  07/05/2023   INFLUENZA VACCINE  07/25/2023 (Originally 11/25/2022)   Pneumonia Vaccine 33+ Years old  Completed   HPV VACCINES  Aged Out   DEXA SCAN  Discontinued    Health Maintenance  Health Maintenance Due  Topic Date Due   Hepatitis C Screening  Never done   Zoster Vaccines- Shingrix (1 of 2) Never done   DTaP/Tdap/Td (2 - Td or Tdap) 09/07/2018   COVID-19 Vaccine (3 - 2024-25 season) 12/26/2022   Medicare Annual Wellness (AWV)  07/05/2023   Health Maintenance Items Addressed: Hepatitis C Screening ordered with next lab work. Discussed the need to update tetanus and shingles vaccines.  Additional Screening:  Vision Screening:  Recommended annual ophthalmology exams for early detection of glaucoma and other disorders of the eye. Up to date [facility]  Dental  Screening: Recommended annual dental exams for proper oral hygiene  Community Resource Referral / Chronic Care Management: CRR required this visit?  No   CCM required this visit?  No     Plan:     I have personally reviewed and noted the following in the patient's chart:   Medical and social history Use of alcohol, tobacco or illicit drugs  Current medications and supplements including opioid prescriptions. Patient is not currently taking opioid prescriptions. Functional ability and status Nutritional status Physical activity Advanced directives List of other physicians Hospitalizations, surgeries, and ER visits in previous 12 months Vitals Screenings to include cognitive, depression, and falls Referrals and appointments  In addition, I have reviewed and discussed with patient certain preventive protocols, quality metrics, and best practice recommendations. A written personalized care plan for preventive services as well as general preventive health recommendations were provided to patient.     Sydell Axon, LPN   1/61/0960   After Visit Summary: (MyChart) Due to this being a telephonic visit, the after visit summary with patients personalized plan was offered to patient via MyChart   Notes: Nothing significant to report at this time.

## 2023-08-30 DIAGNOSIS — K080 Exfoliation of teeth due to systemic causes: Secondary | ICD-10-CM | POA: Diagnosis not present

## 2023-09-13 ENCOUNTER — Ambulatory Visit (INDEPENDENT_AMBULATORY_CARE_PROVIDER_SITE_OTHER): Payer: Medicare Other | Admitting: Internal Medicine

## 2023-09-13 ENCOUNTER — Encounter: Payer: Self-pay | Admitting: Internal Medicine

## 2023-09-13 VITALS — BP 128/70 | HR 83 | Temp 98.0°F | Resp 16 | Ht 64.5 in | Wt 161.0 lb

## 2023-09-13 DIAGNOSIS — E538 Deficiency of other specified B group vitamins: Secondary | ICD-10-CM | POA: Diagnosis not present

## 2023-09-13 DIAGNOSIS — Z0000 Encounter for general adult medical examination without abnormal findings: Secondary | ICD-10-CM

## 2023-09-13 DIAGNOSIS — K080 Exfoliation of teeth due to systemic causes: Secondary | ICD-10-CM | POA: Diagnosis not present

## 2023-09-13 DIAGNOSIS — R413 Other amnesia: Secondary | ICD-10-CM

## 2023-09-13 DIAGNOSIS — I10 Essential (primary) hypertension: Secondary | ICD-10-CM

## 2023-09-13 DIAGNOSIS — E7800 Pure hypercholesterolemia, unspecified: Secondary | ICD-10-CM | POA: Diagnosis not present

## 2023-09-13 MED ORDER — ROSUVASTATIN CALCIUM 5 MG PO TABS: 5.0000 mg | ORAL_TABLET | Freq: Every day | ORAL | 1 refills | Status: DC

## 2023-09-13 MED ORDER — AMLODIPINE BESYLATE 10 MG PO TABS: ORAL_TABLET | ORAL | 1 refills | Status: DC

## 2023-09-13 NOTE — Assessment & Plan Note (Signed)
 On crestor .  Follow lipid panel and liver function tests.  Check lipid panel today.

## 2023-09-13 NOTE — Assessment & Plan Note (Signed)
 Blood pressure is doing well. Continue amlodipine . Follow pressures. Follow metabolic panel.

## 2023-09-13 NOTE — Assessment & Plan Note (Signed)
 Discussed. Feels is stable. Discussed neurology evaluation and medication. Will notify me if desires further intervention.

## 2023-09-13 NOTE — Assessment & Plan Note (Addendum)
 Physical today 09/13/23.  Declines mammogram and colonoscopy.  Notify if agreeable for bone density. Discussed.

## 2023-09-13 NOTE — Progress Notes (Signed)
 Subjective:    Patient ID: Debra Barr, female    DOB: 1945-11-09, 78 y.o.   MRN: [id number]  Patient here for  Chief Complaint  Patient presents with   Annual Exam    HPI Here for a physical exam. Continues on amlodipine  and crestor .  Reports she is doing well. Staying active. No chest  pain or sob. No abdominal pain or bowel change reported. Is traveling. Stays active. Feels memory is stable. States mainly has problems remembering names.    Past Medical History:  Diagnosis Date   Arthritis    Past Surgical History:  Procedure Laterality Date   NO PAST SURGERIES     Family History  Problem Relation Age of Onset   Arthritis Mother    Early death Father    Hyperlipidemia Father    Hypertension Brother    Kidney disease Brother    Social History   Socioeconomic History   Marital status: Married    Spouse name: Not on file   Number of children: Not on file   Years of education: Not on file   Highest education level: Associate degree: occupational, Scientist, product/process development, or vocational program  Occupational History   Not on file  Tobacco Use   Smoking status: Never   Smokeless tobacco: Never  Vaping Use   Vaping status: Never Used  Substance and Sexual Activity   Alcohol use: Yes   Drug use: Never   Sexual activity: Not Currently  Other Topics Concern   Not on file  Social History Narrative   Married   Social Drivers of Health   Financial Resource Strain: Low Risk  (07/05/2023)   Overall Financial Resource Strain (CARDIA)    Difficulty of Paying Living Expenses: Not hard at all  Food Insecurity: No Food Insecurity (07/05/2023)   Hunger Vital Sign    Worried About Running Out of Food in the Last Year: Never true    Ran Out of Food in the Last Year: Never true  Transportation Needs: No Transportation Needs (07/05/2023)   PRAPARE - Administrator, Civil Service (Medical): No    Lack of Transportation (Non-Medical): No  Physical Activity: Insufficiently  Active (07/05/2023)   Exercise Vital Sign    Days of Exercise per Week: 2 days    Minutes of Exercise per Session: 30 min  Stress: No Stress Concern Present (07/05/2023)   [employer] of Occupational Health - Occupational Stress Questionnaire    Feeling of Stress : Only a little  Social Connections: Socially Integrated (07/05/2023)   Social Connection and Isolation Panel [NHANES]    Frequency of Communication with Friends and Family: Three times a week    Frequency of Social Gatherings with Friends and Family: Three times a week    Attends Religious Services: More than 4 times per year    Active Member of Clubs or Organizations: Yes    Attends Banker Meetings: More than 4 times per year    Marital Status: Married     Review of Systems  Constitutional:  Negative for appetite change and unexpected weight change.  HENT:  Negative for congestion, sinus pressure and sore throat.   Eyes:  Negative for pain and visual disturbance.  Respiratory:  Negative for cough, chest tightness and shortness of breath.   Cardiovascular:  Negative for chest pain, palpitations and leg swelling.  Gastrointestinal:  Negative for abdominal pain, diarrhea, nausea and vomiting.  Genitourinary:  Negative for difficulty urinating and dysuria.  Musculoskeletal:  Negative for joint swelling and myalgias.  Skin:  Negative for color change and rash.  Neurological:  Negative for dizziness and headaches.  Hematological:  Negative for adenopathy. Does not bruise/bleed easily.  Psychiatric/Behavioral:  Negative for agitation and dysphoric mood.        Objective:     BP 128/70   Pulse 83   Temp 98 F (36.7 C)   Resp 16   Ht 5' 4.5" (1.638 m)   Wt 161 lb (73 kg)   SpO2 98%   BMI 27.21 kg/m  Wt Readings from Last 3 Encounters:  09/13/23 161 lb (73 kg)  07/06/23 160 lb (72.6 kg)  03/14/23 160 lb 3.2 oz (72.7 kg)    Physical Exam Vitals reviewed.  Constitutional:      General: She is  not in acute distress.    Appearance: Normal appearance. She is well-developed.  HENT:     Head: Normocephalic and atraumatic.     Right Ear: External ear normal.     Left Ear: External ear normal.     Mouth/Throat:     Pharynx: No oropharyngeal exudate or posterior oropharyngeal erythema.  Eyes:     General: No scleral icterus.       Right eye: No discharge.        Left eye: No discharge.     Conjunctiva/sclera: Conjunctivae normal.  Neck:     Thyroid : No thyromegaly.  Cardiovascular:     Rate and Rhythm: Normal rate and regular rhythm.  Pulmonary:     Effort: No tachypnea, accessory muscle usage or respiratory distress.     Breath sounds: Normal breath sounds. No decreased breath sounds or wheezing.  Chest:  Breasts:    Right: No inverted nipple, mass, nipple discharge or tenderness (no axillary adenopathy).     Left: No inverted nipple, mass, nipple discharge or tenderness (no axilarry adenopathy).  Abdominal:     General: Bowel sounds are normal.     Palpations: Abdomen is soft.     Tenderness: There is no abdominal tenderness.  Musculoskeletal:        General: No tenderness.     Cervical back: Neck supple.     Comments: Left lower extremity - larger than right - lymphedema - stable (present for years)  Lymphadenopathy:     Cervical: No cervical adenopathy.  Skin:    Findings: No erythema or rash.  Neurological:     Mental Status: She is alert and oriented to person, place, and time.  Psychiatric:        Mood and Affect: Mood normal.        Behavior: Behavior normal.         Outpatient Encounter Medications as of 09/13/2023  Medication Sig   amLODipine  (NORVASC ) 10 MG tablet TAKE 1 TABLET BY MOUTH DAILY   Ascorbic Acid (VITAMIN C GUMMIES PO) Take by mouth. Take two by mouth daily   Biotin 1000 MCG tablet Take 1,000 mcg by mouth daily.   rosuvastatin  (CRESTOR ) 5 MG tablet Take 1 tablet (5 mg total) by mouth daily.   [DISCONTINUED] amLODipine  (NORVASC ) 10 MG  tablet TAKE 1 TABLET BY MOUTH DAILY   [DISCONTINUED] rosuvastatin  (CRESTOR ) 5 MG tablet Take 1 tablet (5 mg total) by mouth daily.   No facility-administered encounter medications on file as of 09/13/2023.     Lab Results  Component Value Date   WBC 5.4 09/10/2022   HGB 14.1 09/10/2022   HCT 42.7 09/10/2022   PLT 378.0 09/10/2022  GLUCOSE 77 03/14/2023   CHOL 144 03/14/2023   TRIG 87.0 03/14/2023   HDL 53.90 03/14/2023   LDLCALC 73 03/14/2023   ALT 11 03/14/2023   AST 15 03/14/2023   NA 142 03/14/2023   K 4.2 03/14/2023   CL 103 03/14/2023   CREATININE 0.79 03/14/2023   BUN 20 03/14/2023   CO2 29 03/14/2023   TSH 0.81 09/10/2022         Assessment & Plan:  Routine general medical examination at a health care facility  Hypercholesterolemia Assessment & Plan: On crestor .  Follow lipid panel and liver function tests.  Check lipid panel today.   Orders: -     Basic metabolic panel with GFR -     Hepatic function panel -     Lipid panel -     TSH -     CBC with Differential/Platelet  B12 deficiency Assessment & Plan: Rechekc b12 level with labs today.   Orders: -     CBC with Differential/Platelet -     Vitamin B12  Healthcare maintenance Assessment & Plan: Physical today 09/13/23.  Declines mammogram and colonoscopy.  Notify if agreeable for bone density. Discussed.    Hypertension, essential Assessment & Plan: Blood pressure is doing well. Continue amlodipine . Follow pressures. Follow metabolic panel.    Memory change Assessment & Plan: Discussed. Feels is stable. Discussed neurology evaluation and medication. Will notify me if desires further intervention.    Other orders -     amLODIPine  Besylate; TAKE 1 TABLET BY MOUTH DAILY  Dispense: 90 tablet; Refill: 1 -     Rosuvastatin  Calcium ; Take 1 tablet (5 mg total) by mouth daily.  Dispense: 90 tablet; Refill: 1     Dellar Fenton, MD

## 2023-09-13 NOTE — Assessment & Plan Note (Signed)
 Rechekc b12 level with labs today.

## 2023-09-14 ENCOUNTER — Ambulatory Visit: Payer: Self-pay | Admitting: Internal Medicine

## 2023-09-14 LAB — VITAMIN B12: Vitamin B-12: 952 pg/mL — ABNORMAL HIGH (ref 211–911)

## 2023-09-14 LAB — LIPID PANEL
Cholesterol: 172 mg/dL (ref 0–200)
HDL: 61.1 mg/dL (ref >39.00)
LDL Cholesterol: 91 mg/dL (ref 0–99)
NonHDL: 110.64
Total CHOL/HDL Ratio: 3
Triglycerides: 99 mg/dL (ref 0.0–149.0)
VLDL: 19.8 mg/dL (ref 0.0–40.0)

## 2023-09-14 LAB — CBC WITH DIFFERENTIAL/PLATELET
Basophils Absolute: 0 10*3/uL (ref 0.0–0.1)
Basophils Relative: 0.7 % (ref 0.0–3.0)
Eosinophils Absolute: 0.1 10*3/uL (ref 0.0–0.7)
Eosinophils Relative: 0.9 % (ref 0.0–5.0)
HCT: 40.8 % (ref 36.0–46.0)
Hemoglobin: 13.4 g/dL (ref 12.0–15.0)
Lymphocytes Relative: 43 % (ref 12.0–46.0)
Lymphs Abs: 2.6 10*3/uL (ref 0.7–4.0)
MCHC: 33 g/dL (ref 30.0–36.0)
MCV: 85.4 fl (ref 78.0–100.0)
Monocytes Absolute: 0.5 10*3/uL (ref 0.1–1.0)
Monocytes Relative: 7.7 % (ref 3.0–12.0)
Neutro Abs: 2.9 10*3/uL (ref 1.4–7.7)
Neutrophils Relative %: 47.7 % (ref 43.0–77.0)
Platelets: 359 10*3/uL (ref 150.0–400.0)
RBC: 4.77 Mil/uL (ref 3.87–5.11)
RDW: 15.8 % — ABNORMAL HIGH (ref 11.5–15.5)
WBC: 6.1 10*3/uL (ref 4.0–10.5)

## 2023-09-14 LAB — BASIC METABOLIC PANEL WITH GFR
BUN: 19 mg/dL (ref 6–23)
CO2: 29 meq/L (ref 19–32)
Calcium: 9 mg/dL (ref 8.4–10.5)
Chloride: 104 meq/L (ref 96–112)
Creatinine, Ser: 0.77 mg/dL (ref 0.40–1.20)
GFR: 73.51 mL/min (ref >60.00)
Glucose, Bld: 68 mg/dL — ABNORMAL LOW (ref 70–99)
Potassium: 4.1 meq/L (ref 3.5–5.1)
Sodium: 140 meq/L (ref 135–145)

## 2023-09-14 LAB — TSH: TSH: 0.92 u[IU]/mL (ref 0.35–5.50)

## 2023-09-14 LAB — HEPATIC FUNCTION PANEL
ALT: 20 U/L (ref 0–35)
AST: 20 U/L (ref 0–37)
Albumin: 4.1 g/dL (ref 3.5–5.2)
Alkaline Phosphatase: 133 U/L — ABNORMAL HIGH (ref 39–117)
Bilirubin, Direct: 0.1 mg/dL (ref 0.0–0.3)
Total Bilirubin: 0.5 mg/dL (ref 0.2–1.2)
Total Protein: 7.4 g/dL (ref 6.0–8.3)

## 2023-09-27 DIAGNOSIS — K080 Exfoliation of teeth due to systemic causes: Secondary | ICD-10-CM | POA: Diagnosis not present

## 2023-10-03 DIAGNOSIS — K080 Exfoliation of teeth due to systemic causes: Secondary | ICD-10-CM | POA: Diagnosis not present

## 2023-10-04 NOTE — Progress Notes (Shared)
 [facility] - Clinic Note  10/10/2023     CHIEF COMPLAINT Patient presents for No chief complaint on file.   HISTORY OF PRESENT ILLNESS: Debra Barr is a 78 y.o. female who presents to the clinic today for:    Pt states she doesn't feel like her vision is the same, but it's not extraordinarily different either, she is not seeing any distortion with straight lines, she states her eyes are dry, but she is not using any drops  Referring physician: Artemisa Lars, MD [address] [place],  [place] [address]  HISTORICAL INFORMATION:   Selected notes from the MEDICAL RECORD NUMBER Referred by Dr. Kirkland Peppers for ERM OU / cataract clearance OU LEE:  Ocular Hx- PMH-    CURRENT MEDICATIONS: No current outpatient medications on file. (Ophthalmic Drugs)   No current facility-administered medications for this visit. (Ophthalmic Drugs)   Current Outpatient Medications (Other)  Medication Sig   amLODipine  (NORVASC ) 10 MG tablet TAKE 1 TABLET BY MOUTH DAILY   Ascorbic Acid (VITAMIN C GUMMIES PO) Take by mouth. Take two by mouth daily   Biotin 1000 MCG tablet Take 1,000 mcg by mouth daily.   rosuvastatin  (CRESTOR ) 5 MG tablet Take 1 tablet (5 mg total) by mouth daily.   No current facility-administered medications for this visit. (Other)   REVIEW OF SYSTEMS:     ALLERGIES No Known Allergies  PAST MEDICAL HISTORY Past Medical History:  Diagnosis Date   Arthritis    Past Surgical History:  Procedure Laterality Date   NO PAST SURGERIES     FAMILY HISTORY Family History  Problem Relation Age of Onset   Arthritis Mother    Early death Father    Hyperlipidemia Father    Hypertension Brother    Kidney disease Brother    SOCIAL HISTORY Social History   Tobacco Use   Smoking status: Never   Smokeless tobacco: Never  Vaping Use   Vaping status: Never Used  Substance Use Topics   Alcohol use: Yes   Drug use: Never        OPHTHALMIC EXAM:  Not recorded    IMAGING AND PROCEDURES  Imaging and Procedures for 10/10/2023          ASSESSMENT/PLAN:    ICD-10-CM   1. Epiretinal membrane (ERM) of both eyes  H35.373     2. Essential hypertension  I10     3. Hypertensive retinopathy of both eyes  H35.033     4. Pseudophakia of both eyes  Z96.1     5. Dry eyes  H04.123       Epiretinal membrane, both eyes  - mild ERM OU w/ blunting of foveal contour, central retinal thickening and mild pucker OU -- stable - BCVA OD 20/40 from 20/30; OS 20/50 from 20/40 -- ?dry eyes - OCT stable OU; OD: ERM with loss of foveal contour, central thickening and early pucker -- stable; OS: ERM with blunted foveal contour, central thickening and early pucker -- stable from prior - mild metamorphopsia -- noted while looking at [facility] floors only - pt not interested in surgery at this time - monitor for now - f/u 6 mos, sooner prn -- DFE/OCT  2,3. Hypertensive retinopathy OU - discussed importance of tight BP control - monitor - monitor  4. Pseudophakia OU  - s/p CE/IOL OU (Dr. Kirkland Peppers, December, 2023)  - IOLs in good position, doing well  - monitor  - monitor  5. Dry  eyes OU - recommend artificial tears and lubricating ointment as needed  - monitor  Ophthalmic Meds Ordered this visit:  No orders of the defined types were placed in this encounter.    No follow-ups on file.  There are no Patient Instructions on file for this visit.   Explained the diagnoses, plan, and follow up with the patient and they expressed understanding.  Patient expressed understanding of the importance of proper follow up care.   This document serves as a record of services personally performed by Jeanice Millard, MD, PhD. It was created on their behalf by Mayola Specking, COA an ophthalmic technician. The creation of this record is the provider's dictation and/or activities during the visit.   Electronically signed by:  Carrington Clack, COT  10/04/23  11:48 AM    Jeanice Millard, M.D., Ph.D. Diseases & Surgery of the Retina and Vitreous [facility]    Abbreviations: M myopia (nearsighted); A astigmatism; H hyperopia (farsighted); P presbyopia; Mrx spectacle prescription;  CTL contact lenses; OD right eye; OS left eye; OU both eyes  XT exotropia; ET esotropia; PEK punctate epithelial keratitis; PEE punctate epithelial erosions; DES dry eye syndrome; MGD meibomian gland dysfunction; ATs artificial tears; PFAT's preservative free artificial tears; NSC nuclear sclerotic cataract; PSC posterior subcapsular cataract; ERM epi-retinal membrane; PVD posterior vitreous detachment; RD retinal detachment; DM diabetes mellitus; DR diabetic retinopathy; NPDR non-proliferative diabetic retinopathy; PDR proliferative diabetic retinopathy; CSME clinically significant macular edema; DME diabetic macular edema; dbh dot blot hemorrhages; CWS cotton wool spot; POAG primary open angle glaucoma; C/D cup-to-disc ratio; HVF humphrey visual field; GVF goldmann visual field; OCT optical coherence tomography; IOP intraocular pressure; BRVO Branch retinal vein occlusion; CRVO central retinal vein occlusion; CRAO central retinal artery occlusion; BRAO branch retinal artery occlusion; RT retinal tear; SB scleral buckle; PPV pars plana vitrectomy; VH Vitreous hemorrhage; PRP panretinal laser photocoagulation; IVK intravitreal kenalog; VMT vitreomacular traction; MH Macular hole;  NVD neovascularization of the disc; NVE neovascularization elsewhere; AREDS age related eye disease study; ARMD age related macular degeneration; POAG primary open angle glaucoma; EBMD epithelial/anterior basement membrane dystrophy; ACIOL anterior chamber intraocular lens; IOL intraocular lens; PCIOL posterior chamber intraocular lens; Phaco/IOL phacoemulsification with intraocular lens placement; PRK photorefractive keratectomy; LASIK laser assisted in  situ keratomileusis; HTN hypertension; DM diabetes mellitus; COPD chronic obstructive pulmonary disease

## 2023-10-10 ENCOUNTER — Encounter (INDEPENDENT_AMBULATORY_CARE_PROVIDER_SITE_OTHER): Payer: Self-pay

## 2023-10-10 ENCOUNTER — Encounter (INDEPENDENT_AMBULATORY_CARE_PROVIDER_SITE_OTHER): Payer: Medicare Other | Admitting: Ophthalmology

## 2023-10-10 DIAGNOSIS — H35373 Puckering of macula, bilateral: Secondary | ICD-10-CM

## 2023-10-10 DIAGNOSIS — Z961 Presence of intraocular lens: Secondary | ICD-10-CM

## 2023-10-10 DIAGNOSIS — H35033 Hypertensive retinopathy, bilateral: Secondary | ICD-10-CM

## 2023-10-10 DIAGNOSIS — H04123 Dry eye syndrome of bilateral lacrimal glands: Secondary | ICD-10-CM

## 2023-10-10 DIAGNOSIS — I10 Essential (primary) hypertension: Secondary | ICD-10-CM

## 2023-10-13 NOTE — Progress Notes (Signed)
 [facility] - Clinic Note  10/24/2023     CHIEF COMPLAINT Patient presents for Retina Follow Up   HISTORY OF PRESENT ILLNESS: Debra Barr is a 78 y.o. female who presents to the clinic today for:   HPI     Retina Follow Up   In both eyes.  This started 6 months ago.  Duration of 6 months.  Since onset it is stable.  I, the attending physician,  performed the HPI with the patient and updated documentation appropriately.        Comments   6 month retina follow ERM OU pt is reporting no vision changes noticed she denies any flashes has some floaters       Last edited by Valdemar Rogue, MD on 10/25/2023  2:30 AM.     Pt states   Referring physician: Meridee Seltzer, MD [address] [place],  [place] [address]  HISTORICAL INFORMATION:   Selected notes from the MEDICAL RECORD NUMBER Referred by Dr. Meridee for ERM OU / cataract clearance OU LEE:  Ocular Hx- PMH-    CURRENT MEDICATIONS: No current outpatient medications on file. (Ophthalmic Drugs)   No current facility-administered medications for this visit. (Ophthalmic Drugs)   Current Outpatient Medications (Other)  Medication Sig   amLODipine  (NORVASC ) 10 MG tablet TAKE 1 TABLET BY MOUTH DAILY   Ascorbic Acid (VITAMIN C GUMMIES PO) Take by mouth. Take two by mouth daily   Biotin 1000 MCG tablet Take 1,000 mcg by mouth daily.   rosuvastatin  (CRESTOR ) 5 MG tablet Take 1 tablet (5 mg total) by mouth daily.   No current facility-administered medications for this visit. (Other)   REVIEW OF SYSTEMS: ROS   Positive for: Cardiovascular, Eyes Negative for: Constitutional, Gastrointestinal, Neurological, Skin, Genitourinary, Musculoskeletal, HENT, Endocrine, Respiratory, Psychiatric, Allergic/Imm, Heme/Lymph Last edited by Resa Delon ORN, COT on 10/24/2023  1:26 PM.     ALLERGIES No Known Allergies  PAST MEDICAL HISTORY Past Medical History:  Diagnosis Date    Arthritis    Past Surgical History:  Procedure Laterality Date   NO PAST SURGERIES     FAMILY HISTORY Family History  Problem Relation Age of Onset   Arthritis Mother    Early death Father    Hyperlipidemia Father    Hypertension Brother    Kidney disease Brother    SOCIAL HISTORY Social History   Tobacco Use   Smoking status: Never   Smokeless tobacco: Never  Vaping Use   Vaping status: Never Used  Substance Use Topics   Alcohol use: Yes   Drug use: Never       OPHTHALMIC EXAM:  Base Eye Exam     Visual Acuity (Snellen - Linear)       Right Left   Dist [place] 20/30 -2 20/50 -2   Dist ph [place] NI NI         Tonometry (Tonopen, 1:34 PM)       Right Left   Pressure 9 12         Pupils       Pupils Dark Light Shape React APD   Right PERRL 2 1 Round Brisk None   Left PERRL 2 1 Round Brisk None         Visual Fields       Left Right    Full Full         Extraocular Movement       Right Left  Full, Ortho Full, Ortho         Neuro/Psych     Oriented x3: Yes   Mood/Affect: Normal         Dilation     Both eyes: 2.5% Phenylephrine @ 1:34 PM           Slit Lamp and Fundus Exam     Slit Lamp Exam       Right Left   Lids/Lashes Dermatochalasis - upper lid Dermatochalasis - upper lid, mild MGD   Conjunctiva/Sclera White and quiet White and quiet   Cornea Mild EBMD, tear film debris, well healed cataract wound Mild EBMD, tear film debris, well healed cataract wound, 1-2+ Punctate epithelial erosions   Anterior Chamber deep, clear, narrow temporal angle deep, clear, narrow temporal angle   Iris Round and dilated Round and dilated   Lens PC IOL in good position, trace posterior capsular opacification PC IOL in good position, 1+posterior capsular opacification   Anterior Vitreous mild syneresis mild syneresis         Fundus Exam       Right Left   Disc mild Pallor, Sharp rim Pink and Sharp   C/D Ratio 0.7 0.6   Macula Flat,  Blunted foveal reflex, ERM with mild striae, RPE mottling, no heme Flat, Blunted foveal reflex, ERM with mild striae, no heme   Vessels attenuated, mild tortuosity attenuated, mild tortuosity   Periphery Attached, no heme Attached, No heme           IMAGING AND PROCEDURES  Imaging and Procedures for 10/24/2023  OCT, Retina - OU - Both Eyes       Right Eye Quality was good. Central Foveal Thickness: 434. Progression has been stable. Findings include no IRF, no SRF, abnormal foveal contour, epiretinal membrane, macular pucker (ERM with central thickening, blunted foveal contour and very mild pucker -- stable).   Left Eye Quality was good. Central Foveal Thickness: 411. Progression has been stable. Findings include no IRF, no SRF, abnormal foveal contour, epiretinal membrane, macular pucker (ERM with blunted foveal contour, central thickening and mild pucker -- stable from prior).   Notes *Images captured and stored on drive  Diagnosis / Impression:  OD: ERM with central thickening, blunted foveal contour and very mild pucker -- stable OS: ERM with blunted foveal contour, central thickening and mild pucker -- stable from prior  Clinical management:  See below  Abbreviations: NFP - Normal foveal profile. CME - cystoid macular edema. PED - pigment epithelial detachment. IRF - intraretinal fluid. SRF - subretinal fluid. EZ - ellipsoid zone. ERM - epiretinal membrane. ORA - outer retinal atrophy. ORT - outer retinal tubulation. SRHM - subretinal hyper-reflective material. IRHM - intraretinal hyper-reflective material            ASSESSMENT/PLAN:    ICD-10-CM   1. 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 Essential hypertension  I10     3. Hypertensive retinopathy of both eyes  H35.033     4. Pseudophakia of both eyes  Z96.1     5. Dry eyes  H04.123     6. Combined forms of age-related cataract of both eyes  H25.813      Epiretinal  membrane, both eyes  - mild ERM OU w/ blunting of foveal contour, central retinal thickening and mild pucker OU -- stable - BCVA OD 20/30 from 20/40; OS stable at 20/50 - OCT stable OU; OD: ERM  with central thickening, blunted foveal contour and very mild pucker -- stable; OS: ERM with blunted foveal contour, central thickening and mild pucker -- stable from prior - mild metamorphopsia -- noted while looking at [facility] floors only - pt not interested in surgery at this time - monitor for now - f/u 6 mos, sooner prn -- DFE/OCT  2,3. Hypertensive retinopathy OU - discussed importance of tight BP control - monitor  4. Pseudophakia OU  - s/p CE/IOL OU (Dr. Meridee, December, 2023)  - IOLs in good position, doing well  - monitor  5. Dry eyes OU - recommend artificial tears and lubricating ointment as needed   Ophthalmic Meds Ordered this visit:  No orders of the defined types were placed in this encounter.    Return in about 6 months (around 04/24/2024) for f/u ERM OU, DFE, OCT.  There are no Patient Instructions on file for this visit.   Explained the diagnoses, plan, and follow up with the patient and they expressed understanding.  Patient expressed understanding of the importance of proper follow up care.   This document serves as a record of services personally performed by Redell JUDITHANN Hans, MD, PhD. It was created on their behalf by Avelina Pereyra, COA an ophthalmic technician. The creation of this record is the provider's dictation and/or activities during the visit.   Electronically signed by: Avelina GORMAN Pereyra, COT  10/25/23  2:30 AM   This document serves as a record of services personally performed by Redell JUDITHANN Hans, MD, PhD. It was created on their behalf by Alan PARAS. Delores, OA an ophthalmic technician. The creation of this record is the provider's dictation and/or activities during the visit.    Electronically signed by: Alan PARAS. Delores, OA 10/25/23 2:30 AM  Redell JUDITHANN Hans, M.D., Ph.D. Diseases & Surgery of the Retina and Vitreous [facility]  I have reviewed the above documentation for accuracy and completeness, and I agree with the above. Redell JUDITHANN Hans, M.D., Ph.D. 10/25/23 2:31 AM   Abbreviations: M myopia (nearsighted); A astigmatism; H hyperopia (farsighted); P presbyopia; Mrx spectacle prescription;  CTL contact lenses; OD right eye; OS left eye; OU both eyes  XT exotropia; ET esotropia; PEK punctate epithelial keratitis; PEE punctate epithelial erosions; DES dry eye syndrome; MGD meibomian gland dysfunction; ATs artificial tears; PFAT's preservative free artificial tears; NSC nuclear sclerotic cataract; PSC posterior subcapsular cataract; ERM epi-retinal membrane; PVD posterior vitreous detachment; RD retinal detachment; DM diabetes mellitus; DR diabetic retinopathy; NPDR non-proliferative diabetic retinopathy; PDR proliferative diabetic retinopathy; CSME clinically significant macular edema; DME diabetic macular edema; dbh dot blot hemorrhages; CWS cotton wool spot; POAG primary open angle glaucoma; C/D cup-to-disc ratio; HVF humphrey visual field; GVF goldmann visual field; OCT optical coherence tomography; IOP intraocular pressure; BRVO Branch retinal vein occlusion; CRVO central retinal vein occlusion; CRAO central retinal artery occlusion; BRAO branch retinal artery occlusion; RT retinal tear; SB scleral buckle; PPV pars plana vitrectomy; VH Vitreous hemorrhage; PRP panretinal laser photocoagulation; IVK intravitreal kenalog; VMT vitreomacular traction; MH Macular hole;  NVD neovascularization of the disc; NVE neovascularization elsewhere; AREDS age related eye disease study; ARMD age related macular degeneration; POAG primary open angle glaucoma; EBMD epithelial/anterior basement membrane dystrophy; ACIOL anterior chamber intraocular lens; IOL intraocular lens; PCIOL posterior chamber intraocular lens; Phaco/IOL phacoemulsification  with intraocular lens placement; PRK photorefractive keratectomy; LASIK laser assisted in situ keratomileusis; HTN hypertension; DM diabetes mellitus; COPD chronic obstructive pulmonary disease

## 2023-10-24 ENCOUNTER — Encounter (INDEPENDENT_AMBULATORY_CARE_PROVIDER_SITE_OTHER): Payer: Self-pay | Admitting: Ophthalmology

## 2023-10-24 ENCOUNTER — Ambulatory Visit (INDEPENDENT_AMBULATORY_CARE_PROVIDER_SITE_OTHER): Admitting: Ophthalmology

## 2023-10-24 DIAGNOSIS — H25813 Combined forms of age-related cataract, bilateral: Secondary | ICD-10-CM

## 2023-10-24 DIAGNOSIS — H35373 Puckering of macula, bilateral: Secondary | ICD-10-CM

## 2023-10-24 DIAGNOSIS — Z961 Presence of intraocular lens: Secondary | ICD-10-CM

## 2023-10-24 DIAGNOSIS — H04123 Dry eye syndrome of bilateral lacrimal glands: Secondary | ICD-10-CM

## 2023-10-24 DIAGNOSIS — H35033 Hypertensive retinopathy, bilateral: Secondary | ICD-10-CM

## 2023-10-24 DIAGNOSIS — I10 Essential (primary) hypertension: Secondary | ICD-10-CM

## 2023-10-25 ENCOUNTER — Encounter (INDEPENDENT_AMBULATORY_CARE_PROVIDER_SITE_OTHER): Payer: Self-pay | Admitting: Ophthalmology

## 2024-03-08 DIAGNOSIS — Z9842 Cataract extraction status, left eye: Secondary | ICD-10-CM | POA: Diagnosis not present

## 2024-03-08 DIAGNOSIS — H35373 Puckering of macula, bilateral: Secondary | ICD-10-CM | POA: Diagnosis not present

## 2024-03-08 DIAGNOSIS — H5212 Myopia, left eye: Secondary | ICD-10-CM | POA: Diagnosis not present

## 2024-03-08 DIAGNOSIS — Z9841 Cataract extraction status, right eye: Secondary | ICD-10-CM | POA: Diagnosis not present

## 2024-03-14 ENCOUNTER — Other Ambulatory Visit: Payer: Self-pay | Admitting: Internal Medicine

## 2024-03-16 ENCOUNTER — Encounter: Payer: Self-pay | Admitting: Internal Medicine

## 2024-03-16 ENCOUNTER — Ambulatory Visit: Admitting: Internal Medicine

## 2024-03-16 VITALS — BP 120/68 | HR 87 | Temp 97.4°F | Ht 64.5 in | Wt 155.8 lb

## 2024-03-16 DIAGNOSIS — I10 Essential (primary) hypertension: Secondary | ICD-10-CM

## 2024-03-16 DIAGNOSIS — E538 Deficiency of other specified B group vitamins: Secondary | ICD-10-CM | POA: Diagnosis not present

## 2024-03-16 DIAGNOSIS — E7800 Pure hypercholesterolemia, unspecified: Secondary | ICD-10-CM

## 2024-03-16 DIAGNOSIS — R748 Abnormal levels of other serum enzymes: Secondary | ICD-10-CM

## 2024-03-16 DIAGNOSIS — Z23 Encounter for immunization: Secondary | ICD-10-CM

## 2024-03-16 DIAGNOSIS — R413 Other amnesia: Secondary | ICD-10-CM

## 2024-03-16 LAB — LIPID PANEL
Cholesterol: 148 mg/dL (ref 0–200)
HDL: 69.6 mg/dL (ref >39.00)
LDL Cholesterol: 64 mg/dL (ref 0–99)
NonHDL: 78.8
Total CHOL/HDL Ratio: 2
Triglycerides: 74 mg/dL (ref 0.0–149.0)
VLDL: 14.8 mg/dL (ref 0.0–40.0)

## 2024-03-16 LAB — BASIC METABOLIC PANEL WITH GFR
BUN: 16 mg/dL (ref 6–23)
CO2: 31 meq/L (ref 19–32)
Calcium: 9 mg/dL (ref 8.4–10.5)
Chloride: 105 meq/L (ref 96–112)
Creatinine, Ser: 0.75 mg/dL (ref 0.40–1.20)
GFR: 75.6 mL/min (ref >60.00)
Glucose, Bld: 89 mg/dL (ref 70–99)
Potassium: 4 meq/L (ref 3.5–5.1)
Sodium: 143 meq/L (ref 135–145)

## 2024-03-16 LAB — CBC WITH DIFFERENTIAL/PLATELET
Basophils Absolute: 0 K/uL (ref 0.0–0.1)
Basophils Relative: 0.3 % (ref 0.0–3.0)
Eosinophils Absolute: 0.1 K/uL (ref 0.0–0.7)
Eosinophils Relative: 1.7 % (ref 0.0–5.0)
HCT: 41.7 % (ref 36.0–46.0)
Hemoglobin: 13.5 g/dL (ref 12.0–15.0)
Lymphocytes Relative: 36.4 % (ref 12.0–46.0)
Lymphs Abs: 1.9 K/uL (ref 0.7–4.0)
MCHC: 32.5 g/dL (ref 30.0–36.0)
MCV: 88.1 fl (ref 78.0–100.0)
Monocytes Absolute: 0.3 K/uL (ref 0.1–1.0)
Monocytes Relative: 6.1 % (ref 3.0–12.0)
Neutro Abs: 2.9 K/uL (ref 1.4–7.7)
Neutrophils Relative %: 55.5 % (ref 43.0–77.0)
Platelets: 369 K/uL (ref 150.0–400.0)
RBC: 4.73 Mil/uL (ref 3.87–5.11)
RDW: 15 % (ref 11.5–15.5)
WBC: 5.3 K/uL (ref 4.0–10.5)

## 2024-03-16 LAB — HEPATIC FUNCTION PANEL
ALT: 10 U/L (ref 0–35)
AST: 14 U/L (ref 0–37)
Albumin: 4 g/dL (ref 3.5–5.2)
Alkaline Phosphatase: 136 U/L — ABNORMAL HIGH (ref 39–117)
Bilirubin, Direct: 0.1 mg/dL (ref 0.0–0.3)
Total Bilirubin: 0.6 mg/dL (ref 0.2–1.2)
Total Protein: 6.8 g/dL (ref 6.0–8.3)

## 2024-03-16 MED ORDER — ROSUVASTATIN CALCIUM 5 MG PO TABS: 5.0000 mg | ORAL_TABLET | Freq: Every day | ORAL | 1 refills | Status: AC

## 2024-03-16 MED ORDER — AMLODIPINE BESYLATE 10 MG PO TABS: ORAL_TABLET | ORAL | 1 refills | Status: AC

## 2024-03-16 NOTE — Progress Notes (Unsigned)
 Subjective:    Patient ID: Debra Barr, female    DOB: 1945/11/09, 78 y.o.   MRN: [id number]  Patient here for No chief complaint on file.   HPI Here for a scheduled follow up - follow up regarding hypertension and hypercholesterolemia.    Past Medical History:  Diagnosis Date   Arthritis    Past Surgical History:  Procedure Laterality Date   NO PAST SURGERIES     Family History  Problem Relation Age of Onset   Arthritis Mother    Early death Father    Hyperlipidemia Father    Hypertension Brother    Kidney disease Brother    Social History   Socioeconomic History   Marital status: Married    Spouse name: Not on file   Number of children: Not on file   Years of education: Not on file   Highest education level: Associate degree: occupational, scientist, product/process development, or vocational program  Occupational History   Not on file  Tobacco Use   Smoking status: Never   Smokeless tobacco: Never  Vaping Use   Vaping status: Never Used  Substance and Sexual Activity   Alcohol use: Yes   Drug use: Never   Sexual activity: Not Currently  Other Topics Concern   Not on file  Social History Narrative   Married   Social Drivers of Health   Financial Resource Strain: Low Risk  (07/05/2023)   Overall Financial Resource Strain (CARDIA)    Difficulty of Paying Living Expenses: Not hard at all  Food Insecurity: No Food Insecurity (07/05/2023)   Hunger Vital Sign    Worried About Running Out of Food in the Last Year: Never true    Ran Out of Food in the Last Year: Never true  Transportation Needs: No Transportation Needs (07/05/2023)   PRAPARE - Administrator, Civil Service (Medical): No    Lack of Transportation (Non-Medical): No  Physical Activity: Insufficiently Active (07/05/2023)   Exercise Vital Sign    Days of Exercise per Week: 2 days    Minutes of Exercise per Session: 30 min  Stress: No Stress Concern Present (07/05/2023)   [employer] of Occupational  Health - Occupational Stress Questionnaire    Feeling of Stress : Only a little  Social Connections: Socially Integrated (07/05/2023)   Social Connection and Isolation Panel    Frequency of Communication with Friends and Family: Three times a week    Frequency of Social Gatherings with Friends and Family: Three times a week    Attends Religious Services: More than 4 times per year    Active Member of Clubs or Organizations: Yes    Attends Banker Meetings: More than 4 times per year    Marital Status: Married     Review of Systems     Objective:     There were no vitals taken for this visit. Wt Readings from Last 3 Encounters:  09/13/23 161 lb (73 kg)  07/06/23 160 lb (72.6 kg)  03/14/23 160 lb 3.2 oz (72.7 kg)    Physical Exam  {Perform Simple Foot Exam  Perform Detailed exam:1} {Insert foot Exam (Optional):30965}   Outpatient Encounter Medications as of 03/16/2024  Medication Sig   amLODipine  (NORVASC ) 10 MG tablet TAKE 1 TABLET BY MOUTH DAILY   Ascorbic Acid (VITAMIN C GUMMIES PO) Take by mouth. Take two by mouth daily   Biotin 1000 MCG tablet Take 1,000 mcg by mouth daily.   rosuvastatin  (CRESTOR ) 5  MG tablet TAKE 1 TABLET BY MOUTH DAILY   [DISCONTINUED] rosuvastatin  (CRESTOR ) 5 MG tablet Take 1 tablet (5 mg total) by mouth daily.   No facility-administered encounter medications on file as of 03/16/2024.     Lab Results  Component Value Date   WBC 6.1 09/13/2023   HGB 13.4 09/13/2023   HCT 40.8 09/13/2023   PLT 359.0 09/13/2023   GLUCOSE 68 (L) 09/13/2023   CHOL 172 09/13/2023   TRIG 99.0 09/13/2023   HDL 61.10 09/13/2023   LDLCALC 91 09/13/2023   ALT 20 09/13/2023   AST 20 09/13/2023   NA 140 09/13/2023   K 4.1 09/13/2023   CL 104 09/13/2023   CREATININE 0.77 09/13/2023   BUN 19 09/13/2023   CO2 29 09/13/2023   TSH 0.92 09/13/2023    DG Finger Index Right Result Date: 09/13/2006 **** PRIOR REPORT IMPORTED FROM AN EXTERNAL SYSTEM ****  PRIOR REPORT IMPORTED FROM THE SYNGO WORKFLOW SYSTEM REASON FOR EXAM:    xray rt index finger dr clapp [phone] pain cut finger COMMENTS: PROCEDURE:     DXR - DXR FINGER INDEX 2ND DIGIT RT HA  - Sep 13 2006  5:50PM RESULT:     Three views were obtained. There is a bandage present about the index finger. There is marked deformity of the DIP joint compatible with arthritic spurring. No acute fracture is seen. No soft tissue foreign body is identified. IMPRESSION: 1. No acute fracture is identified. 2. 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 Thank you for the opportunity to contribute to the care of your patient.        Assessment & Plan:  Hypercholesterolemia  B12 deficiency     Allena Hamilton, MD

## 2024-03-17 ENCOUNTER — Other Ambulatory Visit: Payer: Self-pay | Admitting: Internal Medicine

## 2024-03-17 ENCOUNTER — Encounter: Payer: Self-pay | Admitting: Internal Medicine

## 2024-03-17 ENCOUNTER — Ambulatory Visit: Payer: Self-pay | Admitting: Internal Medicine

## 2024-03-17 DIAGNOSIS — R748 Abnormal levels of other serum enzymes: Secondary | ICD-10-CM

## 2024-03-17 NOTE — Assessment & Plan Note (Signed)
Recheck liver panel today.  

## 2024-03-17 NOTE — Assessment & Plan Note (Signed)
 Blood pressure is doing well. Continue amlodipine . No change in medication check metabolic panel.

## 2024-03-17 NOTE — Progress Notes (Signed)
Orders placed for f/u labs.  

## 2024-03-17 NOTE — Assessment & Plan Note (Signed)
 Discussed. Feels is stable. She feels may be related to focus/concentration issue. Desires no further intervention/work up at this time. Follow.

## 2024-03-17 NOTE — Assessment & Plan Note (Signed)
 Discussed the need to take crestor . States she is taking regularly. Continue crestor . Continue diet and exercise. Follow lipid panel.

## 2024-04-02 ENCOUNTER — Other Ambulatory Visit (INDEPENDENT_AMBULATORY_CARE_PROVIDER_SITE_OTHER)

## 2024-04-02 DIAGNOSIS — R748 Abnormal levels of other serum enzymes: Secondary | ICD-10-CM

## 2024-04-02 NOTE — Addendum Note (Signed)
 Addended by: TANDA HARVEY D on: 04/02/2024 02:07 PM   Modules accepted: Orders

## 2024-04-03 LAB — GAMMA GT: GGT: 10 IU/L (ref 0–60)

## 2024-04-03 LAB — SPECIMEN STATUS REPORT

## 2024-04-03 LAB — ALKALINE PHOSPHATASE, ISOENZYMES

## 2024-04-04 ENCOUNTER — Ambulatory Visit: Payer: Self-pay | Admitting: Internal Medicine

## 2024-04-06 LAB — SPECIMEN STATUS REPORT

## 2024-04-06 LAB — ALKALINE PHOSPHATASE, ISOENZYMES
Alkaline Phosphatase: 148 IU/L — AB (ref 49–135)
BONE FRACTION: 28 % (ref 14–68)
INTESTINAL FRAC.: 7 % (ref 0–18)
LIVER FRACTION: 65 % (ref 18–85)

## 2024-04-07 LAB — ALKALINE PHOSPHATASE, ISOENZYMES

## 2024-04-10 NOTE — Telephone Encounter (Unsigned)
 Copied from CRM #[id number]. Topic: Clinical - Lab/Test Results >> Apr 10, 2024  2:11 PM Debra Barr wrote: Reason for CRM: Read Dr doctor to patient Please call and notify alkaline phosphatase level remains elevated. Remainder of her liver function is wnl. I would like to check an abdominal ultrasound to further evaluate her liver.  She would like to go ahead and get the live Ultrasound done.

## 2024-04-11 ENCOUNTER — Other Ambulatory Visit: Payer: Self-pay | Admitting: Internal Medicine

## 2024-04-11 DIAGNOSIS — R748 Abnormal levels of other serum enzymes: Secondary | ICD-10-CM

## 2024-04-11 NOTE — Progress Notes (Signed)
Order placed for abdominal ultrasound.   

## 2024-04-16 NOTE — Progress Notes (Shared)
 " [facility] - Clinic Note  04/30/2024     CHIEF COMPLAINT Patient presents for No chief complaint on file.   HISTORY OF PRESENT ILLNESS: Debra Barr is a 78 y.o. female who presents to the clinic today for:     Pt states   Referring physician: Meridee Seltzer, MD [address] [place],  [place] [address]  HISTORICAL INFORMATION:   Selected notes from the MEDICAL RECORD NUMBER Referred by Dr. Meridee for ERM OU / cataract clearance OU LEE:  Ocular Hx- PMH-    CURRENT MEDICATIONS: No current outpatient medications on file. (Ophthalmic Drugs)   No current facility-administered medications for this visit. (Ophthalmic Drugs)   Current Outpatient Medications (Other)  Medication Sig   amLODipine  (NORVASC ) 10 MG tablet TAKE 1 TABLET BY MOUTH DAILY   Ascorbic Acid (VITAMIN C GUMMIES PO) Take by mouth. Take two by mouth daily   Biotin 1000 MCG tablet Take 1,000 mcg by mouth daily.   rosuvastatin  (CRESTOR ) 5 MG tablet Take 1 tablet (5 mg total) by mouth daily.   Vitamin D-Vitamin K (VITAMIN K2-VITAMIN D3 PO)    No current facility-administered medications for this visit. (Other)   REVIEW OF SYSTEMS:   ALLERGIES No Known Allergies  PAST MEDICAL HISTORY Past Medical History:  Diagnosis Date   Arthritis    Past Surgical History:  Procedure Laterality Date   NO PAST SURGERIES     FAMILY HISTORY Family History  Problem Relation Age of Onset   Arthritis Mother    Early death Father    Hyperlipidemia Father    Hypertension Brother    Kidney disease Brother    SOCIAL HISTORY Social History   Tobacco Use   Smoking status: Never   Smokeless tobacco: Never  Vaping Use   Vaping status: Never Used  Substance Use Topics   Alcohol use: Yes   Drug use: Never       OPHTHALMIC EXAM:  Not recorded    IMAGING AND PROCEDURES  Imaging and Procedures for 04/30/2024          ASSESSMENT/PLAN:    ICD-10-CM   1. Epiretinal  membrane (ERM) of both eyes  H35.373     2. Essential hypertension  I10     3. Hypertensive retinopathy of both eyes  H35.033     4. Pseudophakia of both eyes  Z96.1     5. Dry eyes  H04.123       Epiretinal membrane, both eyes  - mild ERM OU w/ blunting of foveal contour, central retinal thickening and mild pucker OU -- stable - BCVA OD 20/30 from 20/40; OS stable at 20/50 - OCT stable OU; OD: ERM with central thickening, blunted foveal contour and very mild pucker -- stable; OS: ERM with blunted foveal contour, central thickening and mild pucker -- stable from prior - mild metamorphopsia -- noted while looking at [facility] floors only - pt not interested in surgery at this time - monitor for now - f/u 6 mos, sooner prn -- DFE/OCT  2,3. Hypertensive retinopathy OU - discussed importance of tight BP control - monitor   4. Pseudophakia OU  - s/p CE/IOL OU (Dr. Meridee, December, 2023)  - IOLs in good position, doing well  - monitor  5. Dry eyes OU - recommend artificial tears and lubricating ointment as needed   Ophthalmic Meds Ordered this visit:  No orders of the defined types were placed in this encounter.  No follow-ups on file.  There are no Patient Instructions on file for this visit.   Explained the diagnoses, plan, and follow up with the patient and they expressed understanding.  Patient expressed understanding of the importance of proper follow up care.   This document serves as a record of services personally performed by Redell JUDITHANN Hans, MD, PhD. It was created on their behalf by Avelina Pereyra, COA an ophthalmic technician. The creation of this record is the provider's dictation and/or activities during the visit.   Electronically signed by: Avelina GORMAN Pereyra, COT  04/16/2024  2:07 PM   Redell JUDITHANN Hans, M.D., Ph.D. Diseases & Surgery of the Retina and Vitreous [facility]  Abbreviations: M myopia (nearsighted); A astigmatism; H  hyperopia (farsighted); P presbyopia; Mrx spectacle prescription;  CTL contact lenses; OD right eye; OS left eye; OU both eyes  XT exotropia; ET esotropia; PEK punctate epithelial keratitis; PEE punctate epithelial erosions; DES dry eye syndrome; MGD meibomian gland dysfunction; ATs artificial tears; PFAT's preservative free artificial tears; NSC nuclear sclerotic cataract; PSC posterior subcapsular cataract; ERM epi-retinal membrane; PVD posterior vitreous detachment; RD retinal detachment; DM diabetes mellitus; DR diabetic retinopathy; NPDR non-proliferative diabetic retinopathy; PDR proliferative diabetic retinopathy; CSME clinically significant macular edema; DME diabetic macular edema; dbh dot blot hemorrhages; CWS cotton wool spot; POAG primary open angle glaucoma; C/D cup-to-disc ratio; HVF humphrey visual field; GVF goldmann visual field; OCT optical coherence tomography; IOP intraocular pressure; BRVO Branch retinal vein occlusion; CRVO central retinal vein occlusion; CRAO central retinal artery occlusion; BRAO branch retinal artery occlusion; RT retinal tear; SB scleral buckle; PPV pars plana vitrectomy; VH Vitreous hemorrhage; PRP panretinal laser photocoagulation; IVK intravitreal kenalog; VMT vitreomacular traction; MH Macular hole;  NVD neovascularization of the disc; NVE neovascularization elsewhere; AREDS age related eye disease study; ARMD age related macular degeneration; POAG primary open angle glaucoma; EBMD epithelial/anterior basement membrane dystrophy; ACIOL anterior chamber intraocular lens; IOL intraocular lens; PCIOL posterior chamber intraocular lens; Phaco/IOL phacoemulsification with intraocular lens placement; PRK photorefractive keratectomy; LASIK laser assisted in situ keratomileusis; HTN hypertension; DM diabetes mellitus; COPD chronic obstructive pulmonary disease "

## 2024-04-23 ENCOUNTER — Ambulatory Visit
Admission: RE | Admit: 2024-04-23 | Discharge: 2024-04-23 | Disposition: A | Discharge status: Home / Self Care | Source: Ambulatory Visit | Attending: Internal Medicine | Admitting: Internal Medicine

## 2024-04-23 DIAGNOSIS — R748 Abnormal levels of other serum enzymes: Secondary | ICD-10-CM | POA: Insufficient documentation

## 2024-04-29 ENCOUNTER — Ambulatory Visit: Payer: Self-pay | Admitting: Internal Medicine

## 2024-04-30 ENCOUNTER — Encounter (INDEPENDENT_AMBULATORY_CARE_PROVIDER_SITE_OTHER): Admitting: Ophthalmology

## 2024-04-30 DIAGNOSIS — H35373 Puckering of macula, bilateral: Secondary | ICD-10-CM

## 2024-04-30 DIAGNOSIS — H04123 Dry eye syndrome of bilateral lacrimal glands: Secondary | ICD-10-CM

## 2024-04-30 DIAGNOSIS — Z961 Presence of intraocular lens: Secondary | ICD-10-CM

## 2024-04-30 DIAGNOSIS — H35033 Hypertensive retinopathy, bilateral: Secondary | ICD-10-CM

## 2024-04-30 DIAGNOSIS — I10 Essential (primary) hypertension: Secondary | ICD-10-CM

## 2024-05-08 NOTE — Telephone Encounter (Signed)
 Copied from CRM [id number]. Topic: Clinical - Lab/Test Results >> May 08, 2024  9:16 AM Debra Barr wrote: Reason for CRM: Patient returning phone call to [name] about her results. Read the results to her verbatim per Dr. Glendia. Patient is in agreement with the CT order and had no additional questions at this time.

## 2024-05-09 ENCOUNTER — Other Ambulatory Visit: Payer: Self-pay | Admitting: Internal Medicine

## 2024-05-09 DIAGNOSIS — N1330 Unspecified hydronephrosis: Secondary | ICD-10-CM

## 2024-05-09 NOTE — Progress Notes (Signed)
Order placed for met b.

## 2024-05-11 ENCOUNTER — Other Ambulatory Visit (INDEPENDENT_AMBULATORY_CARE_PROVIDER_SITE_OTHER)

## 2024-05-11 DIAGNOSIS — N1330 Unspecified hydronephrosis: Secondary | ICD-10-CM | POA: Diagnosis not present

## 2024-05-11 LAB — BASIC METABOLIC PANEL WITH GFR
BUN: 17 mg/dL (ref 6–23)
CO2: 29 meq/L (ref 19–32)
Calcium: 9 mg/dL (ref 8.4–10.5)
Chloride: 106 meq/L (ref 96–112)
Creatinine, Ser: 0.71 mg/dL (ref 0.40–1.20)
GFR: 80.66 mL/min
Glucose, Bld: 95 mg/dL (ref 70–99)
Potassium: 4.1 meq/L (ref 3.5–5.1)
Sodium: 143 meq/L (ref 135–145)

## 2024-05-12 ENCOUNTER — Ambulatory Visit: Payer: Self-pay | Admitting: Internal Medicine

## 2024-05-14 ENCOUNTER — Other Ambulatory Visit

## 2024-06-12 ENCOUNTER — Encounter (INDEPENDENT_AMBULATORY_CARE_PROVIDER_SITE_OTHER): Admitting: Ophthalmology

## 2024-06-12 DIAGNOSIS — H25813 Combined forms of age-related cataract, bilateral: Secondary | ICD-10-CM

## 2024-06-12 DIAGNOSIS — H35033 Hypertensive retinopathy, bilateral: Secondary | ICD-10-CM

## 2024-06-12 DIAGNOSIS — H04123 Dry eye syndrome of bilateral lacrimal glands: Secondary | ICD-10-CM

## 2024-06-12 DIAGNOSIS — I10 Essential (primary) hypertension: Secondary | ICD-10-CM

## 2024-06-12 DIAGNOSIS — H35373 Puckering of macula, bilateral: Secondary | ICD-10-CM

## 2024-06-12 DIAGNOSIS — Z961 Presence of intraocular lens: Secondary | ICD-10-CM

## 2024-07-10 ENCOUNTER — Ambulatory Visit

## 2024-09-13 ENCOUNTER — Encounter: Admitting: Internal Medicine
